# Patient Record
Sex: Male | Born: 1935 | Race: White | Hispanic: No | Marital: Married | Smoking: Former smoker
Health system: Southern US, Community
[De-identification: ages and names within clinical notes are randomized; demographics above are authoritative.]

## PROBLEM LIST (undated history)

## (undated) DIAGNOSIS — I10 Essential (primary) hypertension: Secondary | ICD-10-CM

## (undated) DIAGNOSIS — K648 Other hemorrhoids: Secondary | ICD-10-CM

## (undated) DIAGNOSIS — E079 Disorder of thyroid, unspecified: Secondary | ICD-10-CM

## (undated) DIAGNOSIS — K2990 Gastroduodenitis, unspecified, without bleeding: Secondary | ICD-10-CM

## (undated) DIAGNOSIS — D649 Anemia, unspecified: Secondary | ICD-10-CM

## (undated) DIAGNOSIS — E538 Deficiency of other specified B group vitamins: Secondary | ICD-10-CM

## (undated) DIAGNOSIS — K219 Gastro-esophageal reflux disease without esophagitis: Secondary | ICD-10-CM

## (undated) DIAGNOSIS — G629 Polyneuropathy, unspecified: Secondary | ICD-10-CM

## (undated) DIAGNOSIS — K5790 Diverticulosis of intestine, part unspecified, without perforation or abscess without bleeding: Secondary | ICD-10-CM

## (undated) DIAGNOSIS — E7800 Pure hypercholesterolemia, unspecified: Secondary | ICD-10-CM

## (undated) HISTORY — PX: UPPER GASTROINTESTINAL ENDOSCOPY: SHX188

## (undated) HISTORY — DX: Pure hypercholesterolemia, unspecified: E78.00

## (undated) HISTORY — DX: Diverticulosis of intestine, part unspecified, without perforation or abscess without bleeding: K57.90

## (undated) HISTORY — DX: Deficiency of other specified B group vitamins: E53.8

## (undated) HISTORY — PX: SKIN SURGERY: SHX2413

## (undated) HISTORY — DX: Polyneuropathy, unspecified: G62.9

## (undated) HISTORY — DX: Other hemorrhoids: K64.8

## (undated) HISTORY — DX: Gastroduodenitis, unspecified, without bleeding: K29.90

## (undated) HISTORY — DX: Anemia, unspecified: D64.9

---

## 2008-10-20 HISTORY — PX: ESOPHAGOGASTRODUODENOSCOPY: SHX1529

## 2014-10-10 HISTORY — PX: COLONOSCOPY: SHX174

## 2015-08-10 DIAGNOSIS — G629 Polyneuropathy, unspecified: Secondary | ICD-10-CM | POA: Diagnosis not present

## 2015-08-15 DIAGNOSIS — M7742 Metatarsalgia, left foot: Secondary | ICD-10-CM | POA: Diagnosis not present

## 2015-08-15 DIAGNOSIS — M7741 Metatarsalgia, right foot: Secondary | ICD-10-CM | POA: Diagnosis not present

## 2015-10-11 DIAGNOSIS — M79671 Pain in right foot: Secondary | ICD-10-CM

## 2015-10-11 DIAGNOSIS — R2689 Other abnormalities of gait and mobility: Secondary | ICD-10-CM | POA: Insufficient documentation

## 2015-10-11 DIAGNOSIS — R200 Anesthesia of skin: Secondary | ICD-10-CM | POA: Insufficient documentation

## 2015-10-11 DIAGNOSIS — M79672 Pain in left foot: Secondary | ICD-10-CM | POA: Diagnosis not present

## 2015-10-11 DIAGNOSIS — G629 Polyneuropathy, unspecified: Secondary | ICD-10-CM | POA: Diagnosis not present

## 2015-10-11 HISTORY — DX: Pain in right foot: M79.671

## 2015-10-11 HISTORY — DX: Anesthesia of skin: R20.0

## 2015-10-11 HISTORY — DX: Other abnormalities of gait and mobility: R26.89

## 2015-10-19 DIAGNOSIS — M5416 Radiculopathy, lumbar region: Secondary | ICD-10-CM | POA: Diagnosis not present

## 2015-10-24 DIAGNOSIS — M79671 Pain in right foot: Secondary | ICD-10-CM | POA: Diagnosis not present

## 2015-10-24 DIAGNOSIS — R2689 Other abnormalities of gait and mobility: Secondary | ICD-10-CM | POA: Diagnosis not present

## 2015-10-24 DIAGNOSIS — M79672 Pain in left foot: Secondary | ICD-10-CM | POA: Diagnosis not present

## 2015-10-24 DIAGNOSIS — R200 Anesthesia of skin: Secondary | ICD-10-CM | POA: Diagnosis not present

## 2015-11-21 DIAGNOSIS — R200 Anesthesia of skin: Secondary | ICD-10-CM | POA: Diagnosis not present

## 2015-11-21 DIAGNOSIS — R2689 Other abnormalities of gait and mobility: Secondary | ICD-10-CM | POA: Diagnosis not present

## 2015-11-21 DIAGNOSIS — M79672 Pain in left foot: Secondary | ICD-10-CM | POA: Diagnosis not present

## 2015-11-21 DIAGNOSIS — M79671 Pain in right foot: Secondary | ICD-10-CM | POA: Diagnosis not present

## 2015-11-27 DIAGNOSIS — H04123 Dry eye syndrome of bilateral lacrimal glands: Secondary | ICD-10-CM | POA: Diagnosis not present

## 2015-12-06 DIAGNOSIS — H26492 Other secondary cataract, left eye: Secondary | ICD-10-CM | POA: Diagnosis not present

## 2015-12-06 DIAGNOSIS — H26491 Other secondary cataract, right eye: Secondary | ICD-10-CM | POA: Diagnosis not present

## 2015-12-11 DIAGNOSIS — R197 Diarrhea, unspecified: Secondary | ICD-10-CM | POA: Diagnosis not present

## 2015-12-11 DIAGNOSIS — R110 Nausea: Secondary | ICD-10-CM | POA: Diagnosis not present

## 2015-12-11 DIAGNOSIS — R1030 Lower abdominal pain, unspecified: Secondary | ICD-10-CM | POA: Diagnosis not present

## 2015-12-13 DIAGNOSIS — K5900 Constipation, unspecified: Secondary | ICD-10-CM | POA: Diagnosis not present

## 2015-12-13 DIAGNOSIS — R1012 Left upper quadrant pain: Secondary | ICD-10-CM | POA: Diagnosis not present

## 2015-12-13 DIAGNOSIS — R197 Diarrhea, unspecified: Secondary | ICD-10-CM | POA: Diagnosis not present

## 2016-01-15 DIAGNOSIS — E538 Deficiency of other specified B group vitamins: Secondary | ICD-10-CM

## 2016-01-15 DIAGNOSIS — N400 Enlarged prostate without lower urinary tract symptoms: Secondary | ICD-10-CM

## 2016-01-15 DIAGNOSIS — I10 Essential (primary) hypertension: Secondary | ICD-10-CM | POA: Insufficient documentation

## 2016-01-15 DIAGNOSIS — E039 Hypothyroidism, unspecified: Secondary | ICD-10-CM | POA: Insufficient documentation

## 2016-01-15 DIAGNOSIS — G629 Polyneuropathy, unspecified: Secondary | ICD-10-CM | POA: Insufficient documentation

## 2016-01-15 DIAGNOSIS — N529 Male erectile dysfunction, unspecified: Secondary | ICD-10-CM

## 2016-01-15 DIAGNOSIS — G5600 Carpal tunnel syndrome, unspecified upper limb: Secondary | ICD-10-CM

## 2016-01-15 DIAGNOSIS — E291 Testicular hypofunction: Secondary | ICD-10-CM

## 2016-01-15 DIAGNOSIS — N62 Hypertrophy of breast: Secondary | ICD-10-CM

## 2016-01-15 DIAGNOSIS — E7800 Pure hypercholesterolemia, unspecified: Secondary | ICD-10-CM

## 2016-01-15 DIAGNOSIS — K4090 Unilateral inguinal hernia, without obstruction or gangrene, not specified as recurrent: Secondary | ICD-10-CM

## 2016-01-15 DIAGNOSIS — K219 Gastro-esophageal reflux disease without esophagitis: Secondary | ICD-10-CM | POA: Insufficient documentation

## 2016-01-15 DIAGNOSIS — I499 Cardiac arrhythmia, unspecified: Secondary | ICD-10-CM

## 2016-01-15 HISTORY — DX: Deficiency of other specified B group vitamins: E53.8

## 2016-01-15 HISTORY — DX: Carpal tunnel syndrome, unspecified upper limb: G56.00

## 2016-01-15 HISTORY — DX: Benign prostatic hyperplasia without lower urinary tract symptoms: N40.0

## 2016-01-15 HISTORY — DX: Pure hypercholesterolemia, unspecified: E78.00

## 2016-01-15 HISTORY — DX: Essential (primary) hypertension: I10

## 2016-01-15 HISTORY — DX: Unilateral inguinal hernia, without obstruction or gangrene, not specified as recurrent: K40.90

## 2016-01-15 HISTORY — DX: Hypothyroidism, unspecified: E03.9

## 2016-01-15 HISTORY — DX: Hypertrophy of breast: N62

## 2016-01-15 HISTORY — DX: Testicular hypofunction: E29.1

## 2016-01-15 HISTORY — DX: Male erectile dysfunction, unspecified: N52.9

## 2016-01-15 HISTORY — DX: Cardiac arrhythmia, unspecified: I49.9

## 2016-01-24 DIAGNOSIS — E7800 Pure hypercholesterolemia, unspecified: Secondary | ICD-10-CM | POA: Diagnosis not present

## 2016-01-24 DIAGNOSIS — Z79899 Other long term (current) drug therapy: Secondary | ICD-10-CM | POA: Diagnosis not present

## 2016-01-24 DIAGNOSIS — K219 Gastro-esophageal reflux disease without esophagitis: Secondary | ICD-10-CM | POA: Diagnosis not present

## 2016-01-24 DIAGNOSIS — I10 Essential (primary) hypertension: Secondary | ICD-10-CM | POA: Diagnosis not present

## 2016-01-24 DIAGNOSIS — G629 Polyneuropathy, unspecified: Secondary | ICD-10-CM | POA: Diagnosis not present

## 2016-01-24 DIAGNOSIS — E538 Deficiency of other specified B group vitamins: Secondary | ICD-10-CM | POA: Diagnosis not present

## 2016-01-24 DIAGNOSIS — Z23 Encounter for immunization: Secondary | ICD-10-CM | POA: Diagnosis not present

## 2016-01-24 DIAGNOSIS — E039 Hypothyroidism, unspecified: Secondary | ICD-10-CM | POA: Diagnosis not present

## 2016-01-24 DIAGNOSIS — Z0000 Encounter for general adult medical examination without abnormal findings: Secondary | ICD-10-CM | POA: Diagnosis not present

## 2016-02-21 DIAGNOSIS — M5416 Radiculopathy, lumbar region: Secondary | ICD-10-CM | POA: Insufficient documentation

## 2016-02-21 DIAGNOSIS — R200 Anesthesia of skin: Secondary | ICD-10-CM | POA: Diagnosis not present

## 2016-02-21 DIAGNOSIS — M79672 Pain in left foot: Secondary | ICD-10-CM | POA: Diagnosis not present

## 2016-02-21 DIAGNOSIS — M79671 Pain in right foot: Secondary | ICD-10-CM | POA: Diagnosis not present

## 2016-02-21 DIAGNOSIS — R2689 Other abnormalities of gait and mobility: Secondary | ICD-10-CM | POA: Diagnosis not present

## 2016-02-21 HISTORY — DX: Radiculopathy, lumbar region: M54.16

## 2016-06-06 DIAGNOSIS — J0140 Acute pansinusitis, unspecified: Secondary | ICD-10-CM | POA: Diagnosis not present

## 2016-06-06 DIAGNOSIS — I10 Essential (primary) hypertension: Secondary | ICD-10-CM | POA: Diagnosis not present

## 2016-07-30 DIAGNOSIS — K4090 Unilateral inguinal hernia, without obstruction or gangrene, not specified as recurrent: Secondary | ICD-10-CM | POA: Diagnosis not present

## 2016-07-30 DIAGNOSIS — E538 Deficiency of other specified B group vitamins: Secondary | ICD-10-CM | POA: Diagnosis not present

## 2016-07-30 DIAGNOSIS — N400 Enlarged prostate without lower urinary tract symptoms: Secondary | ICD-10-CM | POA: Diagnosis not present

## 2016-07-30 DIAGNOSIS — G629 Polyneuropathy, unspecified: Secondary | ICD-10-CM | POA: Diagnosis not present

## 2016-07-30 DIAGNOSIS — E039 Hypothyroidism, unspecified: Secondary | ICD-10-CM | POA: Diagnosis not present

## 2016-07-30 DIAGNOSIS — I10 Essential (primary) hypertension: Secondary | ICD-10-CM | POA: Diagnosis not present

## 2016-07-30 DIAGNOSIS — E7800 Pure hypercholesterolemia, unspecified: Secondary | ICD-10-CM | POA: Diagnosis not present

## 2016-07-30 DIAGNOSIS — K5904 Chronic idiopathic constipation: Secondary | ICD-10-CM | POA: Diagnosis not present

## 2016-07-30 DIAGNOSIS — K219 Gastro-esophageal reflux disease without esophagitis: Secondary | ICD-10-CM | POA: Diagnosis not present

## 2016-07-30 DIAGNOSIS — Z79899 Other long term (current) drug therapy: Secondary | ICD-10-CM | POA: Diagnosis not present

## 2016-07-30 DIAGNOSIS — Z0000 Encounter for general adult medical examination without abnormal findings: Secondary | ICD-10-CM | POA: Diagnosis not present

## 2016-08-15 DIAGNOSIS — R102 Pelvic and perineal pain: Secondary | ICD-10-CM | POA: Diagnosis not present

## 2016-08-15 DIAGNOSIS — R079 Chest pain, unspecified: Secondary | ICD-10-CM | POA: Diagnosis not present

## 2016-08-15 DIAGNOSIS — R0789 Other chest pain: Secondary | ICD-10-CM | POA: Diagnosis not present

## 2016-08-15 DIAGNOSIS — R1032 Left lower quadrant pain: Secondary | ICD-10-CM | POA: Diagnosis not present

## 2016-08-15 DIAGNOSIS — R1012 Left upper quadrant pain: Secondary | ICD-10-CM | POA: Diagnosis not present

## 2016-08-15 DIAGNOSIS — E039 Hypothyroidism, unspecified: Secondary | ICD-10-CM | POA: Diagnosis not present

## 2016-08-15 DIAGNOSIS — R109 Unspecified abdominal pain: Secondary | ICD-10-CM | POA: Diagnosis not present

## 2016-08-15 DIAGNOSIS — Z87891 Personal history of nicotine dependence: Secondary | ICD-10-CM | POA: Diagnosis not present

## 2016-08-15 DIAGNOSIS — R918 Other nonspecific abnormal finding of lung field: Secondary | ICD-10-CM | POA: Diagnosis not present

## 2016-08-15 DIAGNOSIS — I714 Abdominal aortic aneurysm, without rupture: Secondary | ICD-10-CM | POA: Diagnosis not present

## 2016-08-15 DIAGNOSIS — M25511 Pain in right shoulder: Secondary | ICD-10-CM | POA: Diagnosis not present

## 2016-08-15 DIAGNOSIS — M25512 Pain in left shoulder: Secondary | ICD-10-CM | POA: Diagnosis not present

## 2016-08-26 DIAGNOSIS — H26492 Other secondary cataract, left eye: Secondary | ICD-10-CM | POA: Diagnosis not present

## 2016-08-30 DIAGNOSIS — R0789 Other chest pain: Secondary | ICD-10-CM | POA: Diagnosis not present

## 2016-08-30 DIAGNOSIS — E7800 Pure hypercholesterolemia, unspecified: Secondary | ICD-10-CM | POA: Diagnosis not present

## 2016-08-30 DIAGNOSIS — I2510 Atherosclerotic heart disease of native coronary artery without angina pectoris: Secondary | ICD-10-CM | POA: Diagnosis not present

## 2016-09-11 DIAGNOSIS — M5416 Radiculopathy, lumbar region: Secondary | ICD-10-CM | POA: Diagnosis not present

## 2016-09-11 DIAGNOSIS — R200 Anesthesia of skin: Secondary | ICD-10-CM | POA: Diagnosis not present

## 2016-09-11 DIAGNOSIS — M79672 Pain in left foot: Secondary | ICD-10-CM | POA: Diagnosis not present

## 2016-09-11 DIAGNOSIS — M79671 Pain in right foot: Secondary | ICD-10-CM | POA: Diagnosis not present

## 2016-09-24 DIAGNOSIS — E7800 Pure hypercholesterolemia, unspecified: Secondary | ICD-10-CM | POA: Diagnosis not present

## 2016-09-24 DIAGNOSIS — I2510 Atherosclerotic heart disease of native coronary artery without angina pectoris: Secondary | ICD-10-CM | POA: Diagnosis not present

## 2016-09-24 DIAGNOSIS — R0789 Other chest pain: Secondary | ICD-10-CM | POA: Diagnosis not present

## 2016-09-25 DIAGNOSIS — M50321 Other cervical disc degeneration at C4-C5 level: Secondary | ICD-10-CM | POA: Diagnosis not present

## 2016-09-25 DIAGNOSIS — M5030 Other cervical disc degeneration, unspecified cervical region: Secondary | ICD-10-CM | POA: Insufficient documentation

## 2016-09-25 DIAGNOSIS — K219 Gastro-esophageal reflux disease without esophagitis: Secondary | ICD-10-CM | POA: Diagnosis not present

## 2016-09-25 DIAGNOSIS — R079 Chest pain, unspecified: Secondary | ICD-10-CM | POA: Diagnosis not present

## 2016-09-25 DIAGNOSIS — M542 Cervicalgia: Secondary | ICD-10-CM | POA: Diagnosis not present

## 2016-09-25 HISTORY — DX: Other cervical disc degeneration, unspecified cervical region: M50.30

## 2016-09-27 DIAGNOSIS — I6523 Occlusion and stenosis of bilateral carotid arteries: Secondary | ICD-10-CM

## 2016-09-27 HISTORY — DX: Occlusion and stenosis of bilateral carotid arteries: I65.23

## 2016-10-09 DIAGNOSIS — I7090 Unspecified atherosclerosis: Secondary | ICD-10-CM | POA: Diagnosis not present

## 2017-07-31 DIAGNOSIS — M25512 Pain in left shoulder: Secondary | ICD-10-CM | POA: Diagnosis not present

## 2017-07-31 DIAGNOSIS — M25511 Pain in right shoulder: Secondary | ICD-10-CM | POA: Diagnosis not present

## 2017-09-01 DIAGNOSIS — H26492 Other secondary cataract, left eye: Secondary | ICD-10-CM | POA: Diagnosis not present

## 2017-09-01 DIAGNOSIS — H35371 Puckering of macula, right eye: Secondary | ICD-10-CM | POA: Diagnosis not present

## 2017-09-08 DIAGNOSIS — D2239 Melanocytic nevi of other parts of face: Secondary | ICD-10-CM | POA: Diagnosis not present

## 2017-09-08 DIAGNOSIS — L570 Actinic keratosis: Secondary | ICD-10-CM | POA: Diagnosis not present

## 2017-09-08 DIAGNOSIS — C44319 Basal cell carcinoma of skin of other parts of face: Secondary | ICD-10-CM | POA: Diagnosis not present

## 2017-09-10 DIAGNOSIS — J209 Acute bronchitis, unspecified: Secondary | ICD-10-CM | POA: Diagnosis not present

## 2017-09-10 DIAGNOSIS — M5030 Other cervical disc degeneration, unspecified cervical region: Secondary | ICD-10-CM | POA: Diagnosis not present

## 2017-09-10 DIAGNOSIS — I10 Essential (primary) hypertension: Secondary | ICD-10-CM | POA: Diagnosis not present

## 2017-09-24 DIAGNOSIS — D0439 Carcinoma in situ of skin of other parts of face: Secondary | ICD-10-CM | POA: Diagnosis not present

## 2017-10-14 DIAGNOSIS — R5381 Other malaise: Secondary | ICD-10-CM | POA: Diagnosis not present

## 2017-10-14 DIAGNOSIS — E7800 Pure hypercholesterolemia, unspecified: Secondary | ICD-10-CM | POA: Diagnosis not present

## 2017-10-14 DIAGNOSIS — I10 Essential (primary) hypertension: Secondary | ICD-10-CM | POA: Diagnosis not present

## 2017-10-14 DIAGNOSIS — K219 Gastro-esophageal reflux disease without esophagitis: Secondary | ICD-10-CM | POA: Diagnosis not present

## 2017-10-14 DIAGNOSIS — E538 Deficiency of other specified B group vitamins: Secondary | ICD-10-CM | POA: Diagnosis not present

## 2017-10-14 DIAGNOSIS — R5383 Other fatigue: Secondary | ICD-10-CM | POA: Diagnosis not present

## 2017-10-14 DIAGNOSIS — M5030 Other cervical disc degeneration, unspecified cervical region: Secondary | ICD-10-CM | POA: Diagnosis not present

## 2017-10-14 DIAGNOSIS — E039 Hypothyroidism, unspecified: Secondary | ICD-10-CM | POA: Diagnosis not present

## 2017-10-14 DIAGNOSIS — Z0000 Encounter for general adult medical examination without abnormal findings: Secondary | ICD-10-CM | POA: Diagnosis not present

## 2017-10-29 DIAGNOSIS — M549 Dorsalgia, unspecified: Secondary | ICD-10-CM | POA: Diagnosis not present

## 2017-10-30 ENCOUNTER — Emergency Department (HOSPITAL_COMMUNITY)
Admission: EM | Admit: 2017-10-30 | Discharge: 2017-10-30 | Disposition: A | Discharge status: Home / Self Care | Payer: PPO | Attending: Physician Assistant | Admitting: Physician Assistant

## 2017-10-30 ENCOUNTER — Emergency Department (HOSPITAL_COMMUNITY): Payer: PPO

## 2017-10-30 ENCOUNTER — Encounter (HOSPITAL_COMMUNITY): Payer: Self-pay | Admitting: Emergency Medicine

## 2017-10-30 ENCOUNTER — Other Ambulatory Visit: Payer: Self-pay

## 2017-10-30 DIAGNOSIS — Y929 Unspecified place or not applicable: Secondary | ICD-10-CM | POA: Insufficient documentation

## 2017-10-30 DIAGNOSIS — Y999 Unspecified external cause status: Secondary | ICD-10-CM | POA: Diagnosis not present

## 2017-10-30 DIAGNOSIS — S239XXA Sprain of unspecified parts of thorax, initial encounter: Secondary | ICD-10-CM

## 2017-10-30 DIAGNOSIS — I10 Essential (primary) hypertension: Secondary | ICD-10-CM | POA: Diagnosis not present

## 2017-10-30 DIAGNOSIS — X58XXXA Exposure to other specified factors, initial encounter: Secondary | ICD-10-CM | POA: Insufficient documentation

## 2017-10-30 DIAGNOSIS — Z85828 Personal history of other malignant neoplasm of skin: Secondary | ICD-10-CM | POA: Insufficient documentation

## 2017-10-30 DIAGNOSIS — M545 Low back pain: Secondary | ICD-10-CM | POA: Diagnosis not present

## 2017-10-30 DIAGNOSIS — Y939 Activity, unspecified: Secondary | ICD-10-CM | POA: Insufficient documentation

## 2017-10-30 DIAGNOSIS — S233XXA Sprain of ligaments of thoracic spine, initial encounter: Secondary | ICD-10-CM | POA: Diagnosis not present

## 2017-10-30 DIAGNOSIS — S299XXA Unspecified injury of thorax, initial encounter: Secondary | ICD-10-CM | POA: Diagnosis present

## 2017-10-30 DIAGNOSIS — R52 Pain, unspecified: Secondary | ICD-10-CM

## 2017-10-30 DIAGNOSIS — M4184 Other forms of scoliosis, thoracic region: Secondary | ICD-10-CM | POA: Diagnosis not present

## 2017-10-30 DIAGNOSIS — M4804 Spinal stenosis, thoracic region: Secondary | ICD-10-CM | POA: Diagnosis not present

## 2017-10-30 HISTORY — DX: Essential (primary) hypertension: I10

## 2017-10-30 HISTORY — DX: Gastro-esophageal reflux disease without esophagitis: K21.9

## 2017-10-30 HISTORY — DX: Disorder of thyroid, unspecified: E07.9

## 2017-10-30 MED ORDER — HYDROCODONE-ACETAMINOPHEN 5-325 MG PO TABS: ORAL_TABLET | ORAL | 0 refills | Status: DC

## 2017-10-30 MED ORDER — IBUPROFEN 400 MG PO TABS
400.0000 mg | ORAL_TABLET | Freq: Once | ORAL | Status: AC
  Administered 2017-10-30: 400 mg via ORAL
  Filled 2017-10-30: qty 1

## 2017-10-30 MED ORDER — CAMPHOR-MENTHOL-METHYL SAL 1.2-5.7-6.3 % EX PTCH: MEDICATED_PATCH | CUTANEOUS | 0 refills | Status: DC

## 2017-10-30 NOTE — ED Triage Notes (Signed)
Patient complains of back pain that has gradually gotten more severe for the last x7 days. Patient came in today because the pain is his back has become severe enough to make it difficult to get out of the bed. Denies weakness in extremities. Denies fall or any other trauma but states he works in his yard frequently.

## 2017-10-30 NOTE — ED Notes (Signed)
Pt in XR. 

## 2017-10-30 NOTE — ED Notes (Signed)
Patient transported to X-ray 

## 2017-10-30 NOTE — Discharge Instructions (Signed)
Please follow with your primary care doctor in the next 2 days for a check-up. They must obtain records for further management.   Do not hesitate to return to the Emergency Department for any new, worsening or concerning symptoms.   Take vicodin for breakthrough pain, do not drink alcohol, drive, care for children or do other critical tasks while taking vicodin.

## 2017-10-30 NOTE — ED Notes (Signed)
Pt verbalized understanding of all d/c instructions, prescriptions, and f/u information. VSS. All belongings with patient at this time.  

## 2017-10-30 NOTE — ED Notes (Signed)
Delay explained to pt and family. Notified patient and family of order for second XR

## 2017-10-30 NOTE — ED Provider Notes (Signed)
[facility] EMERGENCY DEPARTMENT Provider Note   CSN: [id number] Arrival date & time: 10/30/17  1029     History   Chief Complaint Chief Complaint  Patient presents with  . Back Pain   HPI  Blood pressure (!) 148/85, pulse 68, temperature 99.8 F (37.7 C), temperature source Oral, resp. rate 14, height 5' 10.5" (1.791 m), weight 79.4 kg (175 lb), SpO2 98 %.  Steven Valenzuela is a 82 y.o. male with past medical history significant for hypertension, thyroid disease complaining of atraumatic lower thoracic back pain onset a week ago, has been taking Tylenol at home with little relief.  States that the pain is exacerbated by movement, position and palpation.  There is no associated cough, palpitations, shortness of breath, fever or chills.  He states this is atypical for him.  He is never had any issues with this region however he does have scoliosis and has chronic lumbar issues.  He denies any abdominal pain he has a history of skin cancer.  Past Medical History:  Diagnosis Date  . GERD (gastroesophageal reflux disease)   . Hypertension   . Thyroid disease     There are no active problems to display for this patient.   History reviewed. No pertinent surgical history.      Home Medications    Prior to Admission medications   Medication Sig Start Date End Date Taking? Authorizing Provider  Camphor-Menthol-Methyl Sal (HM SALONPAS PAIN RELIEF) 1.2-5.7-6.3 % PTCH Clean and dry affected area, remove patch from backing film and apply to skin. Apply one patch to the affected area and leave in place for up to 8 to 12 hours. If pain lasts after using the first patch, a second patch may be applied for up to another 8 to 12 hours. 10/30/17   Nissan Frazzini, Elmyra Ricks, PA-C  HYDROcodone-acetaminophen (NORCO/VICODIN) 5-325 MG tablet Take 1-2 tablets by mouth every 6 hours as needed for pain and/or cough. 10/30/17   Flor Houdeshell, Charna Elizabeth    Family History No family history on  file.  Social History Social History   Tobacco Use  . Smoking status: Never Smoker  Substance Use Topics  . Alcohol use: Not Currently  . Drug use: Never     Allergies   Bactrim [sulfamethoxazole-trimethoprim]; Prednisone; and Sulfa antibiotics   Review of Systems Review of Systems  A complete review of systems was obtained and all systems are negative except as noted in the HPI and PMH.   Physical Exam Updated Vital Signs BP 128/88 (BP Location: Right Arm)   Pulse 65   Temp 98.4 F (36.9 C) (Oral)   Resp 14   Ht 5' 10.5" (1.791 m)   Wt 79.4 kg (175 lb)   SpO2 97%   BMI 24.75 kg/m   Physical Exam  Constitutional: He is oriented to person, place, and time. He appears well-developed and well-nourished. No distress.  HENT:  Head: Normocephalic and atraumatic.  Mouth/Throat: Oropharynx is clear and moist.  Eyes: Pupils are equal, round, and reactive to light. Conjunctivae and EOM are normal.  Neck: Normal range of motion. No JVD present. No tracheal deviation present.  Cardiovascular: Normal rate, regular rhythm and intact distal pulses.  Radial pulse equal bilaterally  Pulmonary/Chest: Effort normal and breath sounds normal. No stridor. No respiratory distress. He has no wheezes. He has no rales.  Abdominal: Soft. He exhibits no distension and no mass. There is no tenderness. There is no rebound and no guarding.  Musculoskeletal: Normal range of  motion. He exhibits no edema or tenderness.       Back:  No calf asymmetry, superficial collaterals, palpable cords, edema, Homans sign negative bilaterally.    Neurological: He is alert and oriented to person, place, and time.  Skin: Skin is warm. He is not diaphoretic.  Psychiatric: He has a normal mood and affect.  Nursing note and vitals reviewed.    ED Treatments / Results  Labs (all labs ordered are listed, but only abnormal results are displayed) Labs Reviewed - No data to display  EKG None  Radiology Dg  Thoracic Spine 2 View  Result Date: 10/30/2017 CLINICAL DATA:  Dorsalgia EXAM: THORACIC SPINE 3 VIEWS COMPARISON:  None.  Chest radiograph June 15, 2017 FINDINGS: Frontal, lateral, and swimmer's views were obtained. There is lower thoracic levoscoliosis. No fracture or spondylolisthesis. There is mild disc space narrowing at several levels. No erosive change or paraspinous lesion. There is aortic atherosclerosis. IMPRESSION: Mild scoliosis in the lower thoracic region. Osteoarthritic change noted at several levels. No fracture or spondylolisthesis. There is aortic atherosclerosis. Aortic Atherosclerosis (ICD10-I70.0). Electronically Signed   By: Lowella Grip III M.D.   On: 10/30/2017 15:00   Dg Lumbar Spine Complete  Result Date: 10/30/2017 CLINICAL DATA:  Low back pain.  No known injury. EXAM: LUMBAR SPINE - COMPLETE 4+ VIEW COMPARISON:  CT 12/13/2015. FINDINGS: Lumbar scoliosis concave left. Minimal compressions of T11, T12, and L1 cannot be completely excluded. Perceived minimal compressions may just be related scoliosis. No prominent compression fracture. No other focal abnormality identified. Diffuse multilevel degenerative change. Aortoiliac atherosclerotic vascular calcification. IMPRESSION: 1. Lumbar spine scoliosis concave left. Diffuse multilevel degenerative change. 2. Minimal compressions of T11, T12, and L1 cannot be completely excluded. Perceived minimal compression fractures may just be related to scoliosis. No prominent compression fracture. 3.  Aortoiliac atherosclerotic vascular disease. Electronically Signed   By: Marcello Moores  Register   On: 10/30/2017 13:48    Procedures Procedures (including critical care time)  Medications Ordered in ED Medications  ibuprofen (ADVIL,MOTRIN) tablet 400 mg (400 mg Oral Given 10/30/17 1538)     Initial Impression / Assessment and Plan / ED Course  I have reviewed the triage vital signs and the nursing notes.  Pertinent labs & imaging  results that were available during my care of the patient were reviewed by me and considered in my medical decision making (see chart for details).    Vitals:   10/30/17 1054 10/30/17 1055 10/30/17 1530  BP: (!) 148/85  128/88  Pulse: 68  65  Resp: 14  14  Temp: 99.8 F (37.7 C)  98.4 F (36.9 C)  TempSrc: Oral  Oral  SpO2: 98%  97%  Weight: 79.4 kg (175 lb) 79.4 kg (175 lb)   Height: 5\' 10"  (1.778 m) 5' 10.5" (1.791 m)     Medications  ibuprofen (ADVIL,MOTRIN) tablet 400 mg (400 mg Oral Given 10/30/17 1538)    Nussen Pullin is 82 y.o. male presenting with lower thoracic pain worsening over the course of a week.  She declines pain medication in the ED.  He has no associated cough, shortness of breath, anterior chest pain.  Physical exam reassuring with tenderness along the lower thoracic spine.  Also states that the pain is positional.  I doubt a cardiac/vascular/pulmonary origin of his pain.  Lumbar x-ray with possible contract compression fractures of T11 and T12.  Thoracic x-ray does not show any significant abnormalities.  Patient has been taking Aminofen at home, I have encouraged  him to take ibuprofen and use salon pause patches.  Vicodin for breakthrough pain.  Patient states he has had chronic neck pain and is requesting neurosurgery referral.  I advised him to also follow closely with primary care.  Extensive discussion of return precautions and patient verbalized understanding and teach back technique.  Evaluation does not show pathology that would require ongoing emergent intervention or inpatient treatment. Pt is hemodynamically stable and mentating appropriately. Discussed findings and plan with patient/guardian, who agrees with care plan. All questions answered. Return precautions discussed and outpatient follow up given.      Final Clinical Impressions(s) / ED Diagnoses   Final diagnoses:  Thoracic back sprain, initial encounter    ED Discharge Orders        Ordered     Camphor-Menthol-Methyl Sal (HM SALONPAS PAIN RELIEF) 1.2-5.7-6.3 % PTCH     10/30/17 1534    HYDROcodone-acetaminophen (NORCO/VICODIN) 5-325 MG tablet     10/30/17 1534       Tonya Wantz, Charna Elizabeth 10/30/17 1827    Mackuen, Fredia Sorrow, MD 10/31/17 1943

## 2017-11-03 DIAGNOSIS — I10 Essential (primary) hypertension: Secondary | ICD-10-CM | POA: Diagnosis not present

## 2017-11-03 DIAGNOSIS — M150 Primary generalized (osteo)arthritis: Secondary | ICD-10-CM | POA: Diagnosis not present

## 2017-11-03 DIAGNOSIS — M62830 Muscle spasm of back: Secondary | ICD-10-CM | POA: Diagnosis not present

## 2017-11-03 DIAGNOSIS — M5416 Radiculopathy, lumbar region: Secondary | ICD-10-CM | POA: Diagnosis not present

## 2017-12-10 DIAGNOSIS — M5412 Radiculopathy, cervical region: Secondary | ICD-10-CM | POA: Diagnosis not present

## 2017-12-17 DIAGNOSIS — I10 Essential (primary) hypertension: Secondary | ICD-10-CM | POA: Diagnosis not present

## 2017-12-17 DIAGNOSIS — M5412 Radiculopathy, cervical region: Secondary | ICD-10-CM | POA: Diagnosis not present

## 2017-12-17 DIAGNOSIS — Z135 Encounter for screening for eye and ear disorders: Secondary | ICD-10-CM | POA: Diagnosis not present

## 2017-12-17 DIAGNOSIS — M4802 Spinal stenosis, cervical region: Secondary | ICD-10-CM | POA: Diagnosis not present

## 2017-12-17 DIAGNOSIS — Z6825 Body mass index (BMI) 25.0-25.9, adult: Secondary | ICD-10-CM | POA: Diagnosis not present

## 2018-02-18 DIAGNOSIS — I10 Essential (primary) hypertension: Secondary | ICD-10-CM | POA: Diagnosis not present

## 2018-02-18 DIAGNOSIS — Z6825 Body mass index (BMI) 25.0-25.9, adult: Secondary | ICD-10-CM | POA: Diagnosis not present

## 2018-02-18 DIAGNOSIS — M5412 Radiculopathy, cervical region: Secondary | ICD-10-CM | POA: Diagnosis not present

## 2018-02-24 DIAGNOSIS — M25511 Pain in right shoulder: Secondary | ICD-10-CM | POA: Diagnosis not present

## 2018-02-24 DIAGNOSIS — M6281 Muscle weakness (generalized): Secondary | ICD-10-CM | POA: Diagnosis not present

## 2018-02-24 DIAGNOSIS — M25612 Stiffness of left shoulder, not elsewhere classified: Secondary | ICD-10-CM | POA: Diagnosis not present

## 2018-02-24 DIAGNOSIS — M5489 Other dorsalgia: Secondary | ICD-10-CM | POA: Diagnosis not present

## 2018-02-24 DIAGNOSIS — M25512 Pain in left shoulder: Secondary | ICD-10-CM | POA: Diagnosis not present

## 2018-02-24 DIAGNOSIS — M5412 Radiculopathy, cervical region: Secondary | ICD-10-CM | POA: Diagnosis not present

## 2018-02-24 DIAGNOSIS — M542 Cervicalgia: Secondary | ICD-10-CM | POA: Diagnosis not present

## 2018-02-24 DIAGNOSIS — M2560 Stiffness of unspecified joint, not elsewhere classified: Secondary | ICD-10-CM | POA: Diagnosis not present

## 2018-02-24 DIAGNOSIS — R293 Abnormal posture: Secondary | ICD-10-CM | POA: Diagnosis not present

## 2018-02-24 DIAGNOSIS — M25611 Stiffness of right shoulder, not elsewhere classified: Secondary | ICD-10-CM | POA: Diagnosis not present

## 2018-03-06 DIAGNOSIS — M542 Cervicalgia: Secondary | ICD-10-CM | POA: Diagnosis not present

## 2018-03-06 DIAGNOSIS — R293 Abnormal posture: Secondary | ICD-10-CM | POA: Diagnosis not present

## 2018-03-06 DIAGNOSIS — M5489 Other dorsalgia: Secondary | ICD-10-CM | POA: Diagnosis not present

## 2018-03-06 DIAGNOSIS — M25511 Pain in right shoulder: Secondary | ICD-10-CM | POA: Diagnosis not present

## 2018-03-06 DIAGNOSIS — M5412 Radiculopathy, cervical region: Secondary | ICD-10-CM | POA: Diagnosis not present

## 2018-03-06 DIAGNOSIS — M25512 Pain in left shoulder: Secondary | ICD-10-CM | POA: Diagnosis not present

## 2018-03-06 DIAGNOSIS — M25611 Stiffness of right shoulder, not elsewhere classified: Secondary | ICD-10-CM | POA: Diagnosis not present

## 2018-03-06 DIAGNOSIS — M2560 Stiffness of unspecified joint, not elsewhere classified: Secondary | ICD-10-CM | POA: Diagnosis not present

## 2018-03-06 DIAGNOSIS — M25612 Stiffness of left shoulder, not elsewhere classified: Secondary | ICD-10-CM | POA: Diagnosis not present

## 2018-03-06 DIAGNOSIS — M6281 Muscle weakness (generalized): Secondary | ICD-10-CM | POA: Diagnosis not present

## 2018-03-18 DIAGNOSIS — Z6826 Body mass index (BMI) 26.0-26.9, adult: Secondary | ICD-10-CM | POA: Diagnosis not present

## 2018-03-18 DIAGNOSIS — I10 Essential (primary) hypertension: Secondary | ICD-10-CM | POA: Diagnosis not present

## 2018-06-09 DIAGNOSIS — E039 Hypothyroidism, unspecified: Secondary | ICD-10-CM | POA: Diagnosis not present

## 2018-06-09 DIAGNOSIS — Z0000 Encounter for general adult medical examination without abnormal findings: Secondary | ICD-10-CM | POA: Diagnosis not present

## 2018-06-09 DIAGNOSIS — N400 Enlarged prostate without lower urinary tract symptoms: Secondary | ICD-10-CM | POA: Diagnosis not present

## 2018-06-09 DIAGNOSIS — I10 Essential (primary) hypertension: Secondary | ICD-10-CM | POA: Diagnosis not present

## 2018-06-09 DIAGNOSIS — E7800 Pure hypercholesterolemia, unspecified: Secondary | ICD-10-CM | POA: Diagnosis not present

## 2018-06-09 DIAGNOSIS — Z23 Encounter for immunization: Secondary | ICD-10-CM | POA: Diagnosis not present

## 2018-06-16 DIAGNOSIS — I10 Essential (primary) hypertension: Secondary | ICD-10-CM | POA: Diagnosis not present

## 2018-06-16 DIAGNOSIS — E039 Hypothyroidism, unspecified: Secondary | ICD-10-CM | POA: Diagnosis not present

## 2018-06-16 DIAGNOSIS — N400 Enlarged prostate without lower urinary tract symptoms: Secondary | ICD-10-CM | POA: Diagnosis not present

## 2018-06-16 DIAGNOSIS — E7800 Pure hypercholesterolemia, unspecified: Secondary | ICD-10-CM | POA: Diagnosis not present

## 2018-08-31 DIAGNOSIS — H35371 Puckering of macula, right eye: Secondary | ICD-10-CM | POA: Diagnosis not present

## 2018-09-08 DIAGNOSIS — M25512 Pain in left shoulder: Secondary | ICD-10-CM | POA: Diagnosis not present

## 2018-09-08 DIAGNOSIS — M25511 Pain in right shoulder: Secondary | ICD-10-CM | POA: Diagnosis not present

## 2018-09-08 DIAGNOSIS — G8929 Other chronic pain: Secondary | ICD-10-CM | POA: Diagnosis not present

## 2018-09-08 DIAGNOSIS — M19011 Primary osteoarthritis, right shoulder: Secondary | ICD-10-CM | POA: Diagnosis not present

## 2018-09-08 DIAGNOSIS — M19012 Primary osteoarthritis, left shoulder: Secondary | ICD-10-CM | POA: Diagnosis not present

## 2018-09-15 DIAGNOSIS — J0100 Acute maxillary sinusitis, unspecified: Secondary | ICD-10-CM | POA: Diagnosis not present

## 2018-09-27 DIAGNOSIS — L509 Urticaria, unspecified: Secondary | ICD-10-CM | POA: Diagnosis not present

## 2018-11-13 DIAGNOSIS — J069 Acute upper respiratory infection, unspecified: Secondary | ICD-10-CM | POA: Diagnosis not present

## 2018-11-13 DIAGNOSIS — E039 Hypothyroidism, unspecified: Secondary | ICD-10-CM | POA: Diagnosis not present

## 2018-11-13 DIAGNOSIS — G5680 Other specified mononeuropathies of unspecified upper limb: Secondary | ICD-10-CM | POA: Diagnosis not present

## 2018-12-10 DIAGNOSIS — L570 Actinic keratosis: Secondary | ICD-10-CM | POA: Diagnosis not present

## 2018-12-10 DIAGNOSIS — L578 Other skin changes due to chronic exposure to nonionizing radiation: Secondary | ICD-10-CM | POA: Diagnosis not present

## 2019-01-12 DIAGNOSIS — G8929 Other chronic pain: Secondary | ICD-10-CM | POA: Diagnosis not present

## 2019-01-12 DIAGNOSIS — M25512 Pain in left shoulder: Secondary | ICD-10-CM | POA: Diagnosis not present

## 2019-01-12 DIAGNOSIS — M25511 Pain in right shoulder: Secondary | ICD-10-CM | POA: Diagnosis not present

## 2019-02-11 DIAGNOSIS — M25512 Pain in left shoulder: Secondary | ICD-10-CM | POA: Diagnosis not present

## 2019-02-11 DIAGNOSIS — M25511 Pain in right shoulder: Secondary | ICD-10-CM | POA: Diagnosis not present

## 2019-02-11 DIAGNOSIS — G8929 Other chronic pain: Secondary | ICD-10-CM | POA: Diagnosis not present

## 2019-02-16 DIAGNOSIS — M25512 Pain in left shoulder: Secondary | ICD-10-CM | POA: Diagnosis not present

## 2019-02-16 DIAGNOSIS — M6281 Muscle weakness (generalized): Secondary | ICD-10-CM | POA: Diagnosis not present

## 2019-02-16 DIAGNOSIS — M2560 Stiffness of unspecified joint, not elsewhere classified: Secondary | ICD-10-CM | POA: Diagnosis not present

## 2019-02-16 DIAGNOSIS — M25511 Pain in right shoulder: Secondary | ICD-10-CM | POA: Diagnosis not present

## 2019-02-16 DIAGNOSIS — M25612 Stiffness of left shoulder, not elsewhere classified: Secondary | ICD-10-CM | POA: Diagnosis not present

## 2019-02-16 DIAGNOSIS — M25611 Stiffness of right shoulder, not elsewhere classified: Secondary | ICD-10-CM | POA: Diagnosis not present

## 2019-02-16 DIAGNOSIS — M79601 Pain in right arm: Secondary | ICD-10-CM | POA: Diagnosis not present

## 2019-02-16 DIAGNOSIS — R293 Abnormal posture: Secondary | ICD-10-CM | POA: Diagnosis not present

## 2019-03-08 DIAGNOSIS — M25611 Stiffness of right shoulder, not elsewhere classified: Secondary | ICD-10-CM | POA: Diagnosis not present

## 2019-03-08 DIAGNOSIS — M79601 Pain in right arm: Secondary | ICD-10-CM | POA: Diagnosis not present

## 2019-03-08 DIAGNOSIS — M2560 Stiffness of unspecified joint, not elsewhere classified: Secondary | ICD-10-CM | POA: Diagnosis not present

## 2019-03-08 DIAGNOSIS — M6281 Muscle weakness (generalized): Secondary | ICD-10-CM | POA: Diagnosis not present

## 2019-03-08 DIAGNOSIS — M25512 Pain in left shoulder: Secondary | ICD-10-CM | POA: Diagnosis not present

## 2019-03-08 DIAGNOSIS — R293 Abnormal posture: Secondary | ICD-10-CM | POA: Diagnosis not present

## 2019-03-08 DIAGNOSIS — M25612 Stiffness of left shoulder, not elsewhere classified: Secondary | ICD-10-CM | POA: Diagnosis not present

## 2019-03-08 DIAGNOSIS — M25511 Pain in right shoulder: Secondary | ICD-10-CM | POA: Diagnosis not present

## 2019-03-25 DIAGNOSIS — M25511 Pain in right shoulder: Secondary | ICD-10-CM | POA: Diagnosis not present

## 2019-03-25 DIAGNOSIS — G8929 Other chronic pain: Secondary | ICD-10-CM | POA: Diagnosis not present

## 2019-03-25 DIAGNOSIS — Z9889 Other specified postprocedural states: Secondary | ICD-10-CM | POA: Diagnosis not present

## 2019-03-31 DIAGNOSIS — M25511 Pain in right shoulder: Secondary | ICD-10-CM | POA: Diagnosis not present

## 2019-04-06 DIAGNOSIS — M25511 Pain in right shoulder: Secondary | ICD-10-CM | POA: Diagnosis not present

## 2019-04-06 DIAGNOSIS — G8929 Other chronic pain: Secondary | ICD-10-CM | POA: Diagnosis not present

## 2019-04-06 DIAGNOSIS — M25512 Pain in left shoulder: Secondary | ICD-10-CM | POA: Diagnosis not present

## 2019-04-19 DIAGNOSIS — M75101 Unspecified rotator cuff tear or rupture of right shoulder, not specified as traumatic: Secondary | ICD-10-CM | POA: Diagnosis not present

## 2019-04-19 DIAGNOSIS — M7501 Adhesive capsulitis of right shoulder: Secondary | ICD-10-CM | POA: Diagnosis not present

## 2019-05-11 DIAGNOSIS — Z23 Encounter for immunization: Secondary | ICD-10-CM | POA: Diagnosis not present

## 2019-06-01 DIAGNOSIS — M7501 Adhesive capsulitis of right shoulder: Secondary | ICD-10-CM | POA: Diagnosis not present

## 2019-06-15 DIAGNOSIS — Z87891 Personal history of nicotine dependence: Secondary | ICD-10-CM | POA: Diagnosis not present

## 2019-06-15 DIAGNOSIS — M25511 Pain in right shoulder: Secondary | ICD-10-CM | POA: Diagnosis not present

## 2019-06-15 DIAGNOSIS — Z13228 Encounter for screening for other metabolic disorders: Secondary | ICD-10-CM | POA: Diagnosis not present

## 2019-06-15 DIAGNOSIS — J8410 Pulmonary fibrosis, unspecified: Secondary | ICD-10-CM | POA: Diagnosis not present

## 2019-06-15 DIAGNOSIS — Z13220 Encounter for screening for lipoid disorders: Secondary | ICD-10-CM | POA: Diagnosis not present

## 2019-06-15 DIAGNOSIS — Z130 Encounter for screening for diseases of the blood and blood-forming organs and certain disorders involving the immune mechanism: Secondary | ICD-10-CM | POA: Diagnosis not present

## 2019-06-15 DIAGNOSIS — M62838 Other muscle spasm: Secondary | ICD-10-CM | POA: Diagnosis not present

## 2019-06-15 DIAGNOSIS — Z1321 Encounter for screening for nutritional disorder: Secondary | ICD-10-CM | POA: Diagnosis not present

## 2019-06-15 DIAGNOSIS — M25512 Pain in left shoulder: Secondary | ICD-10-CM | POA: Diagnosis not present

## 2019-06-15 DIAGNOSIS — Z125 Encounter for screening for malignant neoplasm of prostate: Secondary | ICD-10-CM | POA: Diagnosis not present

## 2019-06-15 DIAGNOSIS — G8929 Other chronic pain: Secondary | ICD-10-CM | POA: Diagnosis not present

## 2019-06-15 DIAGNOSIS — Z01818 Encounter for other preprocedural examination: Secondary | ICD-10-CM | POA: Diagnosis not present

## 2019-06-15 DIAGNOSIS — I10 Essential (primary) hypertension: Secondary | ICD-10-CM | POA: Diagnosis not present

## 2019-06-15 DIAGNOSIS — E538 Deficiency of other specified B group vitamins: Secondary | ICD-10-CM | POA: Diagnosis not present

## 2019-06-15 DIAGNOSIS — I491 Atrial premature depolarization: Secondary | ICD-10-CM | POA: Diagnosis not present

## 2019-06-15 DIAGNOSIS — Z0000 Encounter for general adult medical examination without abnormal findings: Secondary | ICD-10-CM | POA: Diagnosis not present

## 2019-06-15 DIAGNOSIS — E039 Hypothyroidism, unspecified: Secondary | ICD-10-CM | POA: Diagnosis not present

## 2019-06-25 DIAGNOSIS — M24111 Other articular cartilage disorders, right shoulder: Secondary | ICD-10-CM | POA: Diagnosis not present

## 2019-06-25 DIAGNOSIS — G629 Polyneuropathy, unspecified: Secondary | ICD-10-CM | POA: Diagnosis not present

## 2019-06-25 DIAGNOSIS — M7522 Bicipital tendinitis, left shoulder: Secondary | ICD-10-CM | POA: Diagnosis not present

## 2019-06-25 DIAGNOSIS — M75101 Unspecified rotator cuff tear or rupture of right shoulder, not specified as traumatic: Secondary | ICD-10-CM | POA: Diagnosis not present

## 2019-06-25 DIAGNOSIS — M7521 Bicipital tendinitis, right shoulder: Secondary | ICD-10-CM | POA: Diagnosis not present

## 2019-06-25 DIAGNOSIS — Z8719 Personal history of other diseases of the digestive system: Secondary | ICD-10-CM | POA: Diagnosis not present

## 2019-06-25 DIAGNOSIS — I10 Essential (primary) hypertension: Secondary | ICD-10-CM | POA: Diagnosis not present

## 2019-06-25 DIAGNOSIS — E039 Hypothyroidism, unspecified: Secondary | ICD-10-CM | POA: Diagnosis not present

## 2019-06-25 DIAGNOSIS — G8918 Other acute postprocedural pain: Secondary | ICD-10-CM | POA: Diagnosis not present

## 2019-06-25 DIAGNOSIS — Z8679 Personal history of other diseases of the circulatory system: Secondary | ICD-10-CM | POA: Diagnosis not present

## 2019-06-25 DIAGNOSIS — E7800 Pure hypercholesterolemia, unspecified: Secondary | ICD-10-CM | POA: Diagnosis not present

## 2019-06-25 DIAGNOSIS — Z79899 Other long term (current) drug therapy: Secondary | ICD-10-CM | POA: Diagnosis not present

## 2019-06-25 DIAGNOSIS — M7551 Bursitis of right shoulder: Secondary | ICD-10-CM | POA: Diagnosis not present

## 2019-06-25 DIAGNOSIS — K219 Gastro-esophageal reflux disease without esophagitis: Secondary | ICD-10-CM | POA: Diagnosis not present

## 2019-06-25 DIAGNOSIS — I4891 Unspecified atrial fibrillation: Secondary | ICD-10-CM | POA: Diagnosis not present

## 2019-06-25 DIAGNOSIS — J302 Other seasonal allergic rhinitis: Secondary | ICD-10-CM | POA: Diagnosis not present

## 2019-06-25 DIAGNOSIS — M7501 Adhesive capsulitis of right shoulder: Secondary | ICD-10-CM | POA: Diagnosis not present

## 2019-06-25 DIAGNOSIS — M75122 Complete rotator cuff tear or rupture of left shoulder, not specified as traumatic: Secondary | ICD-10-CM | POA: Diagnosis not present

## 2019-06-25 HISTORY — PX: SHOULDER SURGERY: SHX246

## 2019-06-27 DIAGNOSIS — R05 Cough: Secondary | ICD-10-CM | POA: Diagnosis not present

## 2019-06-29 DIAGNOSIS — Z20828 Contact with and (suspected) exposure to other viral communicable diseases: Secondary | ICD-10-CM | POA: Diagnosis not present

## 2019-06-29 DIAGNOSIS — J3489 Other specified disorders of nose and nasal sinuses: Secondary | ICD-10-CM | POA: Diagnosis not present

## 2019-06-30 DIAGNOSIS — M25411 Effusion, right shoulder: Secondary | ICD-10-CM | POA: Diagnosis not present

## 2019-06-30 DIAGNOSIS — M25611 Stiffness of right shoulder, not elsewhere classified: Secondary | ICD-10-CM | POA: Diagnosis not present

## 2019-06-30 DIAGNOSIS — M25511 Pain in right shoulder: Secondary | ICD-10-CM | POA: Diagnosis not present

## 2019-07-05 DIAGNOSIS — M25411 Effusion, right shoulder: Secondary | ICD-10-CM | POA: Diagnosis not present

## 2019-07-05 DIAGNOSIS — M25511 Pain in right shoulder: Secondary | ICD-10-CM | POA: Diagnosis not present

## 2019-07-05 DIAGNOSIS — M25611 Stiffness of right shoulder, not elsewhere classified: Secondary | ICD-10-CM | POA: Diagnosis not present

## 2019-07-09 DIAGNOSIS — M25611 Stiffness of right shoulder, not elsewhere classified: Secondary | ICD-10-CM | POA: Diagnosis not present

## 2019-07-09 DIAGNOSIS — M25411 Effusion, right shoulder: Secondary | ICD-10-CM | POA: Diagnosis not present

## 2019-07-09 DIAGNOSIS — M25511 Pain in right shoulder: Secondary | ICD-10-CM | POA: Diagnosis not present

## 2019-07-12 DIAGNOSIS — M25511 Pain in right shoulder: Secondary | ICD-10-CM | POA: Diagnosis not present

## 2019-07-12 DIAGNOSIS — M25411 Effusion, right shoulder: Secondary | ICD-10-CM | POA: Diagnosis not present

## 2019-07-12 DIAGNOSIS — M25611 Stiffness of right shoulder, not elsewhere classified: Secondary | ICD-10-CM | POA: Diagnosis not present

## 2019-07-16 DIAGNOSIS — M25611 Stiffness of right shoulder, not elsewhere classified: Secondary | ICD-10-CM | POA: Diagnosis not present

## 2019-07-16 DIAGNOSIS — M25411 Effusion, right shoulder: Secondary | ICD-10-CM | POA: Diagnosis not present

## 2019-07-16 DIAGNOSIS — M25511 Pain in right shoulder: Secondary | ICD-10-CM | POA: Diagnosis not present

## 2019-07-20 DIAGNOSIS — M25611 Stiffness of right shoulder, not elsewhere classified: Secondary | ICD-10-CM | POA: Diagnosis not present

## 2019-07-20 DIAGNOSIS — M25511 Pain in right shoulder: Secondary | ICD-10-CM | POA: Diagnosis not present

## 2019-07-20 DIAGNOSIS — M25411 Effusion, right shoulder: Secondary | ICD-10-CM | POA: Diagnosis not present

## 2019-07-23 DIAGNOSIS — M25411 Effusion, right shoulder: Secondary | ICD-10-CM | POA: Diagnosis not present

## 2019-07-23 DIAGNOSIS — M25511 Pain in right shoulder: Secondary | ICD-10-CM | POA: Diagnosis not present

## 2019-07-23 DIAGNOSIS — M25611 Stiffness of right shoulder, not elsewhere classified: Secondary | ICD-10-CM | POA: Diagnosis not present

## 2019-07-27 DIAGNOSIS — M25411 Effusion, right shoulder: Secondary | ICD-10-CM | POA: Diagnosis not present

## 2019-07-27 DIAGNOSIS — M25511 Pain in right shoulder: Secondary | ICD-10-CM | POA: Diagnosis not present

## 2019-07-27 DIAGNOSIS — M25611 Stiffness of right shoulder, not elsewhere classified: Secondary | ICD-10-CM | POA: Diagnosis not present

## 2019-08-02 DIAGNOSIS — M25411 Effusion, right shoulder: Secondary | ICD-10-CM | POA: Diagnosis not present

## 2019-08-02 DIAGNOSIS — M25511 Pain in right shoulder: Secondary | ICD-10-CM | POA: Diagnosis not present

## 2019-08-02 DIAGNOSIS — M25611 Stiffness of right shoulder, not elsewhere classified: Secondary | ICD-10-CM | POA: Diagnosis not present

## 2019-08-05 DIAGNOSIS — M25611 Stiffness of right shoulder, not elsewhere classified: Secondary | ICD-10-CM | POA: Diagnosis not present

## 2019-08-05 DIAGNOSIS — M25511 Pain in right shoulder: Secondary | ICD-10-CM | POA: Diagnosis not present

## 2019-08-05 DIAGNOSIS — M25411 Effusion, right shoulder: Secondary | ICD-10-CM | POA: Diagnosis not present

## 2019-08-09 ENCOUNTER — Encounter: Payer: Self-pay | Admitting: Gastroenterology

## 2019-08-09 DIAGNOSIS — M25511 Pain in right shoulder: Secondary | ICD-10-CM | POA: Diagnosis not present

## 2019-08-09 DIAGNOSIS — M25611 Stiffness of right shoulder, not elsewhere classified: Secondary | ICD-10-CM | POA: Diagnosis not present

## 2019-08-09 DIAGNOSIS — M25411 Effusion, right shoulder: Secondary | ICD-10-CM | POA: Diagnosis not present

## 2019-08-12 DIAGNOSIS — M25611 Stiffness of right shoulder, not elsewhere classified: Secondary | ICD-10-CM | POA: Diagnosis not present

## 2019-08-12 DIAGNOSIS — M25511 Pain in right shoulder: Secondary | ICD-10-CM | POA: Diagnosis not present

## 2019-08-12 DIAGNOSIS — M25411 Effusion, right shoulder: Secondary | ICD-10-CM | POA: Diagnosis not present

## 2019-08-17 DIAGNOSIS — M25511 Pain in right shoulder: Secondary | ICD-10-CM | POA: Diagnosis not present

## 2019-08-17 DIAGNOSIS — M25411 Effusion, right shoulder: Secondary | ICD-10-CM | POA: Diagnosis not present

## 2019-08-17 DIAGNOSIS — M25611 Stiffness of right shoulder, not elsewhere classified: Secondary | ICD-10-CM | POA: Diagnosis not present

## 2019-08-18 ENCOUNTER — Encounter: Payer: Self-pay | Admitting: Gastroenterology

## 2019-08-20 DIAGNOSIS — M25511 Pain in right shoulder: Secondary | ICD-10-CM | POA: Diagnosis not present

## 2019-08-20 DIAGNOSIS — M25411 Effusion, right shoulder: Secondary | ICD-10-CM | POA: Diagnosis not present

## 2019-08-20 DIAGNOSIS — M25611 Stiffness of right shoulder, not elsewhere classified: Secondary | ICD-10-CM | POA: Diagnosis not present

## 2019-08-23 DIAGNOSIS — M25611 Stiffness of right shoulder, not elsewhere classified: Secondary | ICD-10-CM | POA: Diagnosis not present

## 2019-08-23 DIAGNOSIS — M25411 Effusion, right shoulder: Secondary | ICD-10-CM | POA: Diagnosis not present

## 2019-08-23 DIAGNOSIS — M25511 Pain in right shoulder: Secondary | ICD-10-CM | POA: Diagnosis not present

## 2019-08-27 DIAGNOSIS — M25611 Stiffness of right shoulder, not elsewhere classified: Secondary | ICD-10-CM | POA: Diagnosis not present

## 2019-08-27 DIAGNOSIS — M25411 Effusion, right shoulder: Secondary | ICD-10-CM | POA: Diagnosis not present

## 2019-08-27 DIAGNOSIS — M25511 Pain in right shoulder: Secondary | ICD-10-CM | POA: Diagnosis not present

## 2019-08-30 DIAGNOSIS — H35371 Puckering of macula, right eye: Secondary | ICD-10-CM | POA: Diagnosis not present

## 2019-09-01 ENCOUNTER — Ambulatory Visit: Payer: PPO | Admitting: Gastroenterology

## 2019-09-01 ENCOUNTER — Other Ambulatory Visit: Payer: Self-pay

## 2019-09-01 ENCOUNTER — Encounter: Payer: Self-pay | Admitting: Gastroenterology

## 2019-09-01 VITALS — BP 134/80 | HR 51 | Temp 97.1°F | Ht 70.0 in | Wt 181.0 lb

## 2019-09-01 DIAGNOSIS — R1013 Epigastric pain: Secondary | ICD-10-CM | POA: Diagnosis not present

## 2019-09-01 DIAGNOSIS — K5909 Other constipation: Secondary | ICD-10-CM

## 2019-09-01 DIAGNOSIS — Z01818 Encounter for other preprocedural examination: Secondary | ICD-10-CM | POA: Diagnosis not present

## 2019-09-01 MED ORDER — PANTOPRAZOLE SODIUM 40 MG PO TBEC: 40.0000 mg | DELAYED_RELEASE_TABLET | Freq: Every day | ORAL | 11 refills | Status: DC

## 2019-09-01 NOTE — Patient Instructions (Signed)
If you are age 82 or older, your body mass index should be between 23-30. Your Body mass index is 25.97 kg/m. If this is out of the aforementioned range listed, please consider follow up with your Primary Care Provider.  If you are age 52 or younger, your body mass index should be between 19-25. Your Body mass index is 25.97 kg/m. If this is out of the aformentioned range listed, please consider follow up with your Primary Care Provider.   You have been scheduled for an endoscopy. Please follow written instructions given to you at your visit today. If you use inhalers (even only as needed), please bring them with you on the day of your procedure. Your physician has requested that you go to www.startemmi.com and enter the access code given to you at your visit today. This web site gives a general overview about your procedure. However, you should still follow specific instructions given to you by our office regarding your preparation for the procedure.  We have sent the following medications to your pharmacy for you to pick up at your convenience: Protonix  .dottiotc Miralax 17grams once daily in coffee.   Thank you,  Dr. Jackquline Denmark

## 2019-09-01 NOTE — Progress Notes (Signed)
Chief Complaint: Abdominal pain  Referring Provider: Dr. Bea Graff      ASSESSMENT AND PLAN;   #1. Epi pain. D/d PUD, GERD, gastritis, nonulcer dyspepsia, gastroparesis, musculoskeletal etiology, r/o biliary or pancreatic problems.  #2.  GERD.  #3.  Chronic constipation.  With element of IBS.  Neg colon 10/2014, CT AP 12/2015   Plan: - Protonix 40mg  po qd, 30, 6 refills - EGD for further evaluation.  Discussed risks and benefits. - Start Miralax 17g po qd in coffee - CBC, CMP, lipase, TSH, vit B12 (had low B12 in past per notes) at time of EGD. - Walk 30 min/day. - Increase water intake. - If still with problems and the above WU is negative, proceed with [place] abdo followed by CT if needed.    HPI:    Steven Valenzuela is a 84 y.o. male   Accompanied by his family Has been complaining of epigastric discomfort along heartburn with some odynophagia but no dysphagia x 6 months.  Getting worse.  Very much concerned about upper GI malignancies.  Could not identify any definite exacerbating or relieving factors except for Maalox on as-needed basis.  He would also use Tums on as needed basis.  Has generalized abdominal bloating with chronic constipation.  Bowel movements at the frequency of once per week.  Has lower abdominal discomfort which does get better with defecation.  No melena or hematochezia.  No significant weight loss.  No jaundice dark urine or pale stools.  No sodas, chocolates, chewing gums, artificial sweeteners and candy. No NSAIDs  Had severe diarrhea with abdominal pain after eating salad May 2017.  Evaluated by Dr Dolphus Jenny at [facility].  Had a negative CT except for "constipation".  Stool studies were negative.  He had normal CBC, CMP.   Previous GI procedures: -Colonoscopy 10/2014: 6 mm colonic polyp s/p polypectomy (serrated adenoma), mild sigmoid diverticulosis.  Highly redundant colon.  Recommended to repeat only if any new problems. -CT AP with contrast 12/2015: neg  for any acute abnormalities.  Stool-filled colon c/w constipation, extensive atherosclerosis. -EGD 12/2018: Gastroduodenitis. Bx-neg CLO testing. -Barium enema 04/1998: Highly redundant colon. -[place] 11/2012: neg, HIDA scan ejection fraction negative Past Medical History:  Diagnosis Date  . Diverticulosis   . Gastroduodenitis   . GERD (gastroesophageal reflux disease)   . Hypertension   . Internal hemorrhoids   . Thyroid disease     Past Surgical History:  Procedure Laterality Date  . COLONOSCOPY  10/10/2014   Colonic post status polypectomy. Mild predominantly sigmoid diverticulosis. Otherwise normal colonoscopt to TI.  Marland Kitchen ESOPHAGOGASTRODUODENOSCOPY  10/20/2008   Irregular Zline suggestive of GERD. Otherwise normal EGD.    History reviewed. No pertinent family history.  Social History   Tobacco Use  . Smoking status: Never Smoker  . Smokeless tobacco: Never Used  Substance Use Topics  . Alcohol use: Not Currently  . Drug use: Never    Current Outpatient Medications  Medication Sig Dispense Refill  . atenolol (TENORMIN) 25 MG tablet Take by mouth daily.    Marland Kitchen levothyroxine (SYNTHROID) 75 MCG tablet Take 75 mcg by mouth daily before breakfast.     No current facility-administered medications for this visit.    Allergies  Allergen Reactions  . Bactrim [Sulfamethoxazole-Trimethoprim]   . Prednisone   . Sulfa Antibiotics     Review of Systems:  Constitutional: Denies fever, chills, diaphoresis, appetite change and fatigue.  HEENT: Denies photophobia, eye pain, redness, hearing loss, ear pain, congestion, sore throat, rhinorrhea,  sneezing, mouth sores, neck pain, neck stiffness and tinnitus.   Respiratory: Denies SOB, DOE, cough, chest tightness,  and wheezing.   Cardiovascular: Denies chest pain, palpitations and leg swelling.  Genitourinary: Denies dysuria, urgency, frequency, hematuria, flank pain and difficulty urinating.  Musculoskeletal: Has  myalgias, back pain, joint  swelling, arthralgias and gait problem.  Skin: No rash.  Neurological: Denies dizziness, seizures, syncope, weakness, light-headedness, numbness and headaches.  Hematological: Denies adenopathy. Easy bruising, personal or family bleeding history  Psychiatric/Behavioral: Has anxiety or depression     Physical Exam:    BP 134/80   Pulse (!) 51   Temp (!) 97.1 F (36.2 C)   Ht 5\' 10"  (1.778 m)   Wt 181 lb (82.1 kg)   BMI 25.97 kg/m  Wt Readings from Last 3 Encounters:  09/01/19 181 lb (82.1 kg)  10/30/17 175 lb (79.4 kg)   Constitutional: Thin built, in no acute distress. Psychiatric: Normal mood and affect. Behavior is normal. HEENT: Pupils normal.  Conjunctivae are normal. No scleral icterus. Neck supple.  Cardiovascular: Normal rate, regular rhythm. No edema Pulmonary/chest: Effort normal and breath sounds normal. No wheezing, rales or rhonchi. Abdominal: Soft, nondistended.  Mild epigastric tenderness bowel sounds active throughout. There are no masses palpable. No hepatomegaly. Rectal:  defered Neurological: Alert and oriented to person place and time. Skin: Skin is warm and dry. No rashes noted.     Steven Austria, MD 09/01/2019, 2:11 PM  Cc: Dr. Bea Graff

## 2019-09-03 DIAGNOSIS — M25411 Effusion, right shoulder: Secondary | ICD-10-CM | POA: Diagnosis not present

## 2019-09-03 DIAGNOSIS — M25511 Pain in right shoulder: Secondary | ICD-10-CM | POA: Diagnosis not present

## 2019-09-03 DIAGNOSIS — M25611 Stiffness of right shoulder, not elsewhere classified: Secondary | ICD-10-CM | POA: Diagnosis not present

## 2019-09-07 ENCOUNTER — Encounter: Payer: Self-pay | Admitting: Gastroenterology

## 2019-09-15 ENCOUNTER — Other Ambulatory Visit: Payer: Self-pay

## 2019-09-15 ENCOUNTER — Other Ambulatory Visit: Payer: Self-pay | Admitting: Gastroenterology

## 2019-09-15 ENCOUNTER — Ambulatory Visit (INDEPENDENT_AMBULATORY_CARE_PROVIDER_SITE_OTHER): Payer: PPO

## 2019-09-15 ENCOUNTER — Other Ambulatory Visit (INDEPENDENT_AMBULATORY_CARE_PROVIDER_SITE_OTHER): Payer: PPO

## 2019-09-15 DIAGNOSIS — R1013 Epigastric pain: Secondary | ICD-10-CM | POA: Diagnosis not present

## 2019-09-15 DIAGNOSIS — Z1159 Encounter for screening for other viral diseases: Secondary | ICD-10-CM

## 2019-09-15 DIAGNOSIS — K5909 Other constipation: Secondary | ICD-10-CM

## 2019-09-15 LAB — COMPREHENSIVE METABOLIC PANEL
ALT: 12 U/L (ref 0–53)
AST: 16 U/L (ref 0–37)
Albumin: 4 g/dL (ref 3.5–5.2)
Alkaline Phosphatase: 50 U/L (ref 39–117)
BUN: 18 mg/dL (ref 6–23)
CO2: 31 mEq/L (ref 19–32)
Calcium: 9.6 mg/dL (ref 8.4–10.5)
Chloride: 105 mEq/L (ref 96–112)
Creatinine, Ser: 1.23 mg/dL (ref 0.40–1.50)
GFR: 56.42 mL/min — ABNORMAL LOW (ref >60.00)
Glucose, Bld: 86 mg/dL (ref 70–99)
Potassium: 4.1 mEq/L (ref 3.5–5.1)
Sodium: 140 mEq/L (ref 135–145)
Total Bilirubin: 0.7 mg/dL (ref 0.2–1.2)
Total Protein: 7 g/dL (ref 6.0–8.3)

## 2019-09-15 LAB — CBC WITH DIFFERENTIAL/PLATELET
Basophils Absolute: 0 10*3/uL (ref 0.0–0.1)
Basophils Relative: 0.6 % (ref 0.0–3.0)
Eosinophils Absolute: 0.1 10*3/uL (ref 0.0–0.7)
Eosinophils Relative: 1.4 % (ref 0.0–5.0)
HCT: 43.3 % (ref 39.0–52.0)
Hemoglobin: 14.4 g/dL (ref 13.0–17.0)
Lymphocytes Relative: 22.1 % (ref 12.0–46.0)
Lymphs Abs: 1.7 10*3/uL (ref 0.7–4.0)
MCHC: 33.3 g/dL (ref 30.0–36.0)
MCV: 93.6 fl (ref 78.0–100.0)
Monocytes Absolute: 0.8 10*3/uL (ref 0.1–1.0)
Monocytes Relative: 10.1 % (ref 3.0–12.0)
Neutro Abs: 5 10*3/uL (ref 1.4–7.7)
Neutrophils Relative %: 65.8 % (ref 43.0–77.0)
Platelets: 191 10*3/uL (ref 150.0–400.0)
RBC: 4.63 Mil/uL (ref 4.22–5.81)
RDW: 14 % (ref 11.5–15.5)
WBC: 7.6 10*3/uL (ref 4.0–10.5)

## 2019-09-15 LAB — TSH: TSH: 4.39 u[IU]/mL (ref 0.35–4.50)

## 2019-09-15 LAB — VITAMIN B12: Vitamin B-12: 324 pg/mL (ref 211–911)

## 2019-09-16 LAB — SARS CORONAVIRUS 2 (TAT 6-24 HRS): SARS Coronavirus 2: NEGATIVE

## 2019-09-17 ENCOUNTER — Other Ambulatory Visit: Payer: Self-pay

## 2019-09-17 ENCOUNTER — Telehealth: Payer: Self-pay | Admitting: Gastroenterology

## 2019-09-17 ENCOUNTER — Ambulatory Visit (AMBULATORY_SURGERY_CENTER): Payer: PPO | Admitting: Gastroenterology

## 2019-09-17 ENCOUNTER — Encounter: Payer: Self-pay | Admitting: Gastroenterology

## 2019-09-17 VITALS — BP 111/72 | HR 72 | Temp 97.3°F | Resp 16 | Ht 70.0 in | Wt 181.0 lb

## 2019-09-17 DIAGNOSIS — K2950 Unspecified chronic gastritis without bleeding: Secondary | ICD-10-CM

## 2019-09-17 DIAGNOSIS — K253 Acute gastric ulcer without hemorrhage or perforation: Secondary | ICD-10-CM

## 2019-09-17 DIAGNOSIS — R1013 Epigastric pain: Secondary | ICD-10-CM | POA: Diagnosis not present

## 2019-09-17 MED ORDER — ESOMEPRAZOLE MAGNESIUM 40 MG PO PACK: 40.0000 mg | PACK | Freq: Every day | ORAL | 11 refills | Status: AC

## 2019-09-17 MED ORDER — SODIUM CHLORIDE 0.9 % IV SOLN: 500.0000 mL | Freq: Once | INTRAVENOUS | Status: DC

## 2019-09-17 NOTE — Telephone Encounter (Signed)
Conti.. the rx for  Nexium

## 2019-09-17 NOTE — Op Note (Signed)
[facility] Patient Name: Steven Valenzuela Procedure Date: 09/17/2019 9:03 AM MRN: [id number] Endoscopist: Jackquline Denmark , MD Age: 84 Referring MD:  Date of Birth: June 03, 1936 Gender: Male Account #: [id number] Procedure:                Upper GI endoscopy Indications:              Epigastric abdominal pain Medicines:                Monitored Anesthesia Care Procedure:                Pre-Anesthesia Assessment:                           - Prior to the procedure, a History and Physical                            was performed, and patient medications and                            allergies were reviewed. The patient's tolerance of                            previous anesthesia was also reviewed. The risks                            and benefits of the procedure and the sedation                            options and risks were discussed with the patient.                            All questions were answered, and informed consent                            was obtained. Prior Anticoagulants: The patient has                            taken no previous anticoagulant or antiplatelet                            agents. ASA Grade Assessment: III - A patient with                            severe systemic disease. After reviewing the risks                            and benefits, the patient was deemed in                            satisfactory condition to undergo the procedure.                           After obtaining informed consent, the endoscope was  passed under direct vision. Throughout the                            procedure, the patient's blood pressure, pulse, and                            oxygen saturations were monitored continuously. The                            Endoscope was introduced through the mouth, and                            advanced to the second part of duodenum. The upper                            GI endoscopy was accomplished  without difficulty.                            The patient tolerated the procedure well. Scope In: Scope Out: Findings:                 The examined esophagus was normal. Z-line                            well-defined at 40 cm.                           A few localized small erosions with no bleeding and                            no stigmata of recent bleeding were found in the                            gastric body. Mild antral gastritis in form of                            erythema. Biopsies were taken with a cold forceps                            for histology.                           The examined duodenum was normal. Complications:            No immediate complications. Estimated Blood Loss:     Estimated blood loss: none. Impression:               -Erosive gastritis. Recommendation:           - Resume previous diet.                           - Switch to Nexium (esomeprazole) 40 mg PO daily                            #30, 11 refiils.                           -  No ibuprofen, naproxen, or other non-steroidal                            anti-inflammatory drugs.                           - Return to GI office in 8 weeks.                           - I have reviewed labs - CBC, CMP, TSH and B12 were                            within normal limits.                           - D/W Rich Number, MD 09/17/2019 9:23:47 AM This report has been signed electronically.

## 2019-09-17 NOTE — Telephone Encounter (Signed)
Called pharmacy and [facility] for capsules not packets (entered in error)

## 2019-09-17 NOTE — Progress Notes (Signed)
  Temp  LC  VS  DT  Pt's states no medical or surgical changes since previsit or office visit.    

## 2019-09-17 NOTE — Patient Instructions (Addendum)
TAKE NEXIUM 40MG  BY MOUTH EVERYDAY, 30 MINUTES BEFORE BREAKFAST ON AN EMPTY STOMACH. TAKE MILK OF MAGNESIA EVERYDAY FOR CONSTIPATION. DR Steve Rattler OFFICE WILL CALL YOU TO SCHEDULE FOLLOW-UP APPOINTMENT. NO IBUPROFEN, NAPROXEN, OR OTHER NON-STEROIDAL ANTI-INFAMMATORY DRUGS.  TAKE TYLENOL IF YOU NEED SOMETHING OTC FOR PAIN.     YOU HAD AN ENDOSCOPIC PROCEDURE TODAY AT [facility]:   Refer to the procedure report that was given to you for any specific questions about what was found during the examination.  If the procedure report does not answer your questions, please call your gastroenterologist to clarify.  If you requested that your care partner not be given the details of your procedure findings, then the procedure report has been included in a sealed envelope for you to review at your convenience later.  YOU SHOULD EXPECT: Some feelings of bloating in the abdomen. Passage of more gas than usual.  Walking can help get rid of the air that was put into your GI tract during the procedure and reduce the bloating. If you had a lower endoscopy (such as a colonoscopy or flexible sigmoidoscopy) you may notice spotting of blood in your stool or on the toilet paper. If you underwent a bowel prep for your procedure, you may not have a normal bowel movement for a few days.  Please Note:  You might notice some irritation and congestion in your nose or some drainage.  This is from the oxygen used during your procedure.  There is no need for concern and it should clear up in a day or so.  SYMPTOMS TO REPORT IMMEDIATELY:     Following upper endoscopy (EGD)  Vomiting of blood or coffee ground material  New chest pain or pain under the shoulder blades  Painful or persistently difficult swallowing  New shortness of breath  Fever of 100F or higher  Black, tarry-looking stools  For urgent or emergent issues, a gastroenterologist can be reached at any hour by calling [phone].   DIET:   We do recommend a small meal at first, but then you may proceed to your regular diet.  Drink plenty of fluids but you should avoid alcoholic beverages for 24 hours.  ACTIVITY:  You should plan to take it easy for the rest of today and you should NOT DRIVE or use heavy machinery until tomorrow (because of the sedation medicines used during the test).    FOLLOW UP: Our staff will call the number listed on your records 48-72 hours following your procedure to check on you and address any questions or concerns that you may have regarding the information given to you following your procedure. If we do not reach you, we will leave a message.  We will attempt to reach you two times.  During this call, we will ask if you have developed any symptoms of COVID 19. If you develop any symptoms (ie: fever, flu-like symptoms, shortness of breath, cough etc.) before then, please call [phone].  If you test positive for Covid 19 in the 2 weeks post procedure, please call and report this information to [place].    If any biopsies were taken you will be contacted by phone or by letter within the next 1-3 weeks.  Please call us at [phone] if you have not heard about the biopsies in 3 weeks.    SIGNATURES/CONFIDENTIALITY: You and/or your care partner have signed paperwork which will be entered into your electronic medical record.  These signatures attest to the fact  that that the information above on your After Visit Summary has been reviewed and is understood.  Full responsibility of the confidentiality of this discharge information lies with you and/or your care-partner.

## 2019-09-17 NOTE — Progress Notes (Signed)
A/ox3, pleased with MAC, report to RN 

## 2019-09-21 ENCOUNTER — Telehealth: Payer: Self-pay

## 2019-09-21 NOTE — Telephone Encounter (Signed)
  Follow up Call-  Call back number 09/17/2019  Post procedure Call Back phone  # [phone]  Permission to leave phone message Yes  Some recent data might be hidden     Patient questions:  Do you have a fever, pain , or abdominal swelling? No. Pain Score  0 *  Have you tolerated food without any problems? Yes.    Have you been able to return to your normal activities? Yes.    Do you have any questions about your discharge instructions: Diet   No. Medications  No. Follow up visit  No.  Do you have questions or concerns about your Care? No.  Actions: * If pain score is 4 or above: No action needed, pain <4. 1. Have you developed a fever since your procedure? no  2.   Have you had an respiratory symptoms (SOB or cough) since your procedure? no  3.   Have you tested positive for COVID 19 since your procedure no  4.   Have you had any family members/close contacts diagnosed with the COVID 19 since your procedure?  no   If yes to any of these questions please route to Joylene John, RN and Alphonsa Gin, Therapist, sports.

## 2019-09-25 ENCOUNTER — Encounter: Payer: Self-pay | Admitting: Gastroenterology

## 2019-09-26 DIAGNOSIS — K2950 Unspecified chronic gastritis without bleeding: Secondary | ICD-10-CM

## 2020-01-06 IMAGING — CR DG THORACIC SPINE 2V
3 series · 3 of 3 positions shown · non-contrast
Comparison: None.  Chest radiograph June 15, 2017

CLINICAL DATA: Dorsalgia

EXAM:
THORACIC SPINE 3 VIEWS

[t-spine ap]
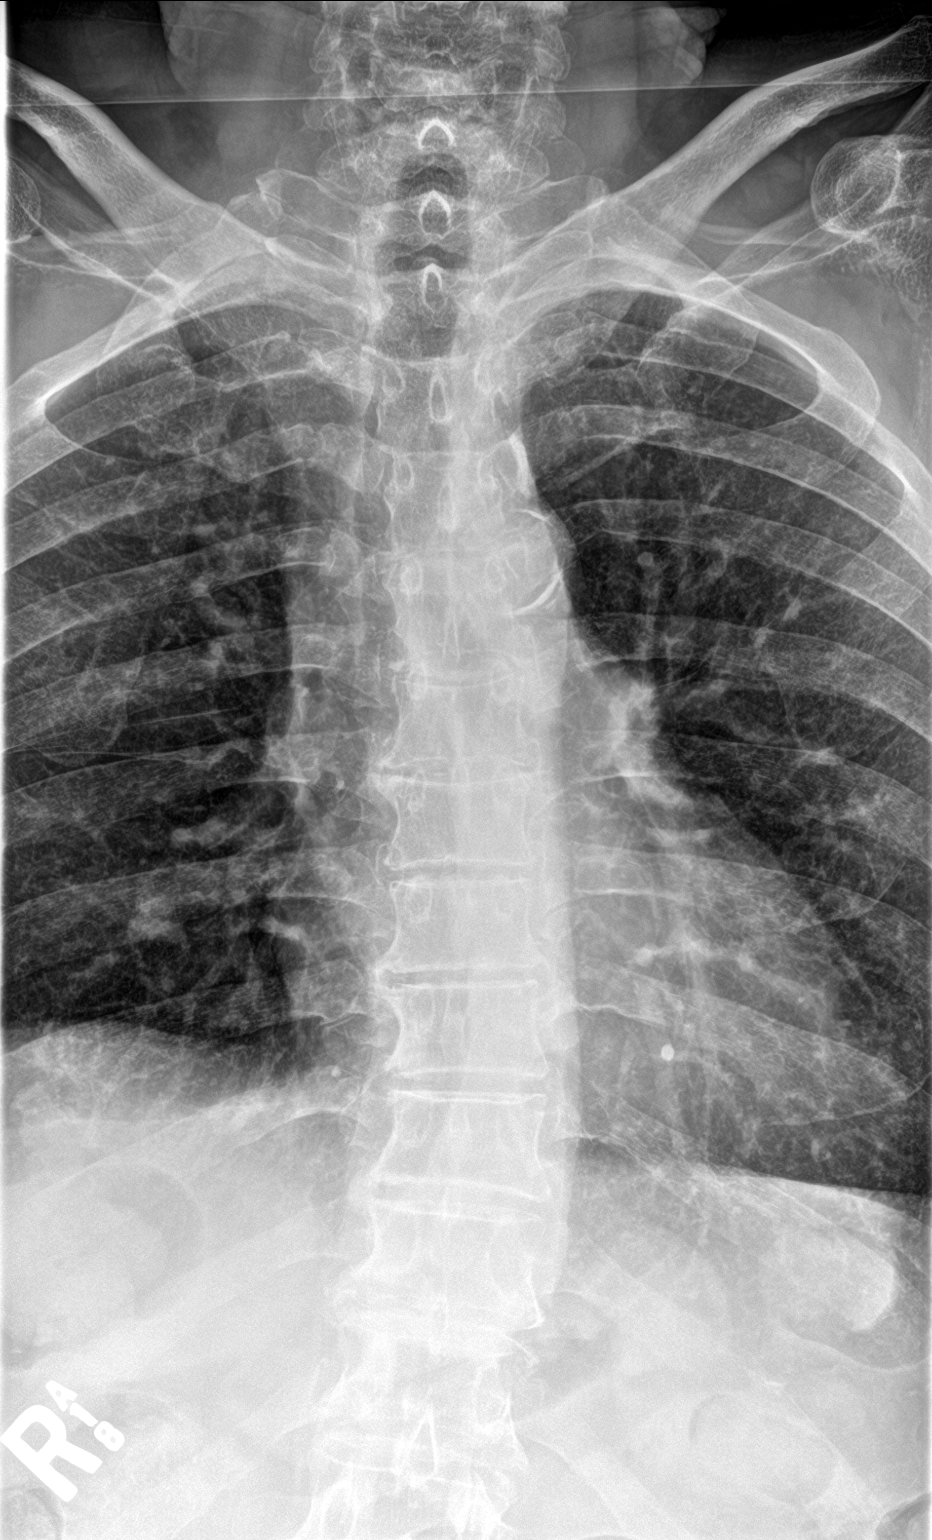

[t-spine lat]
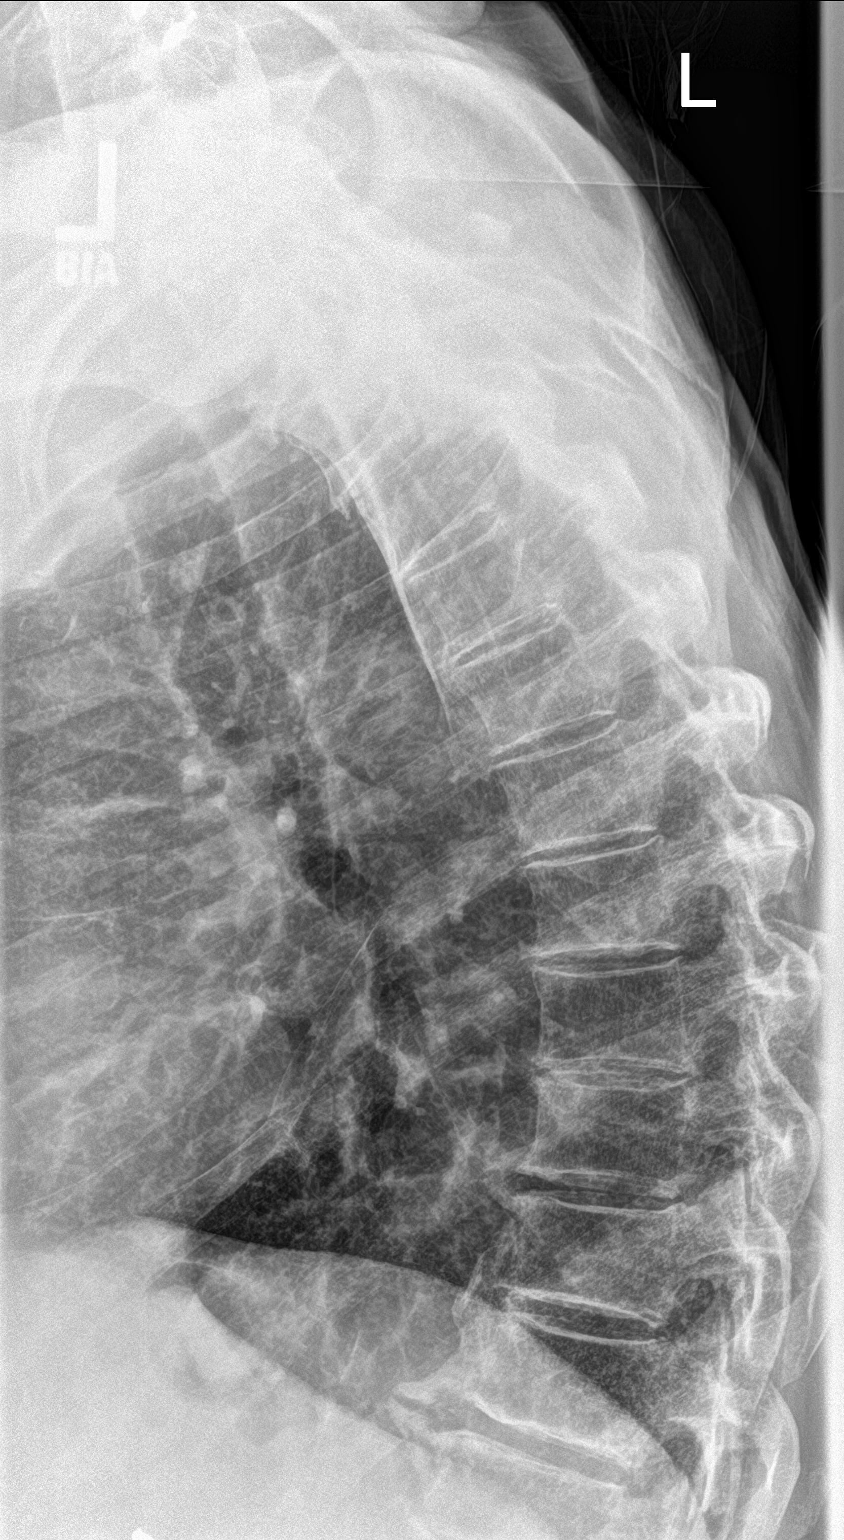

[t-spine swimmers]
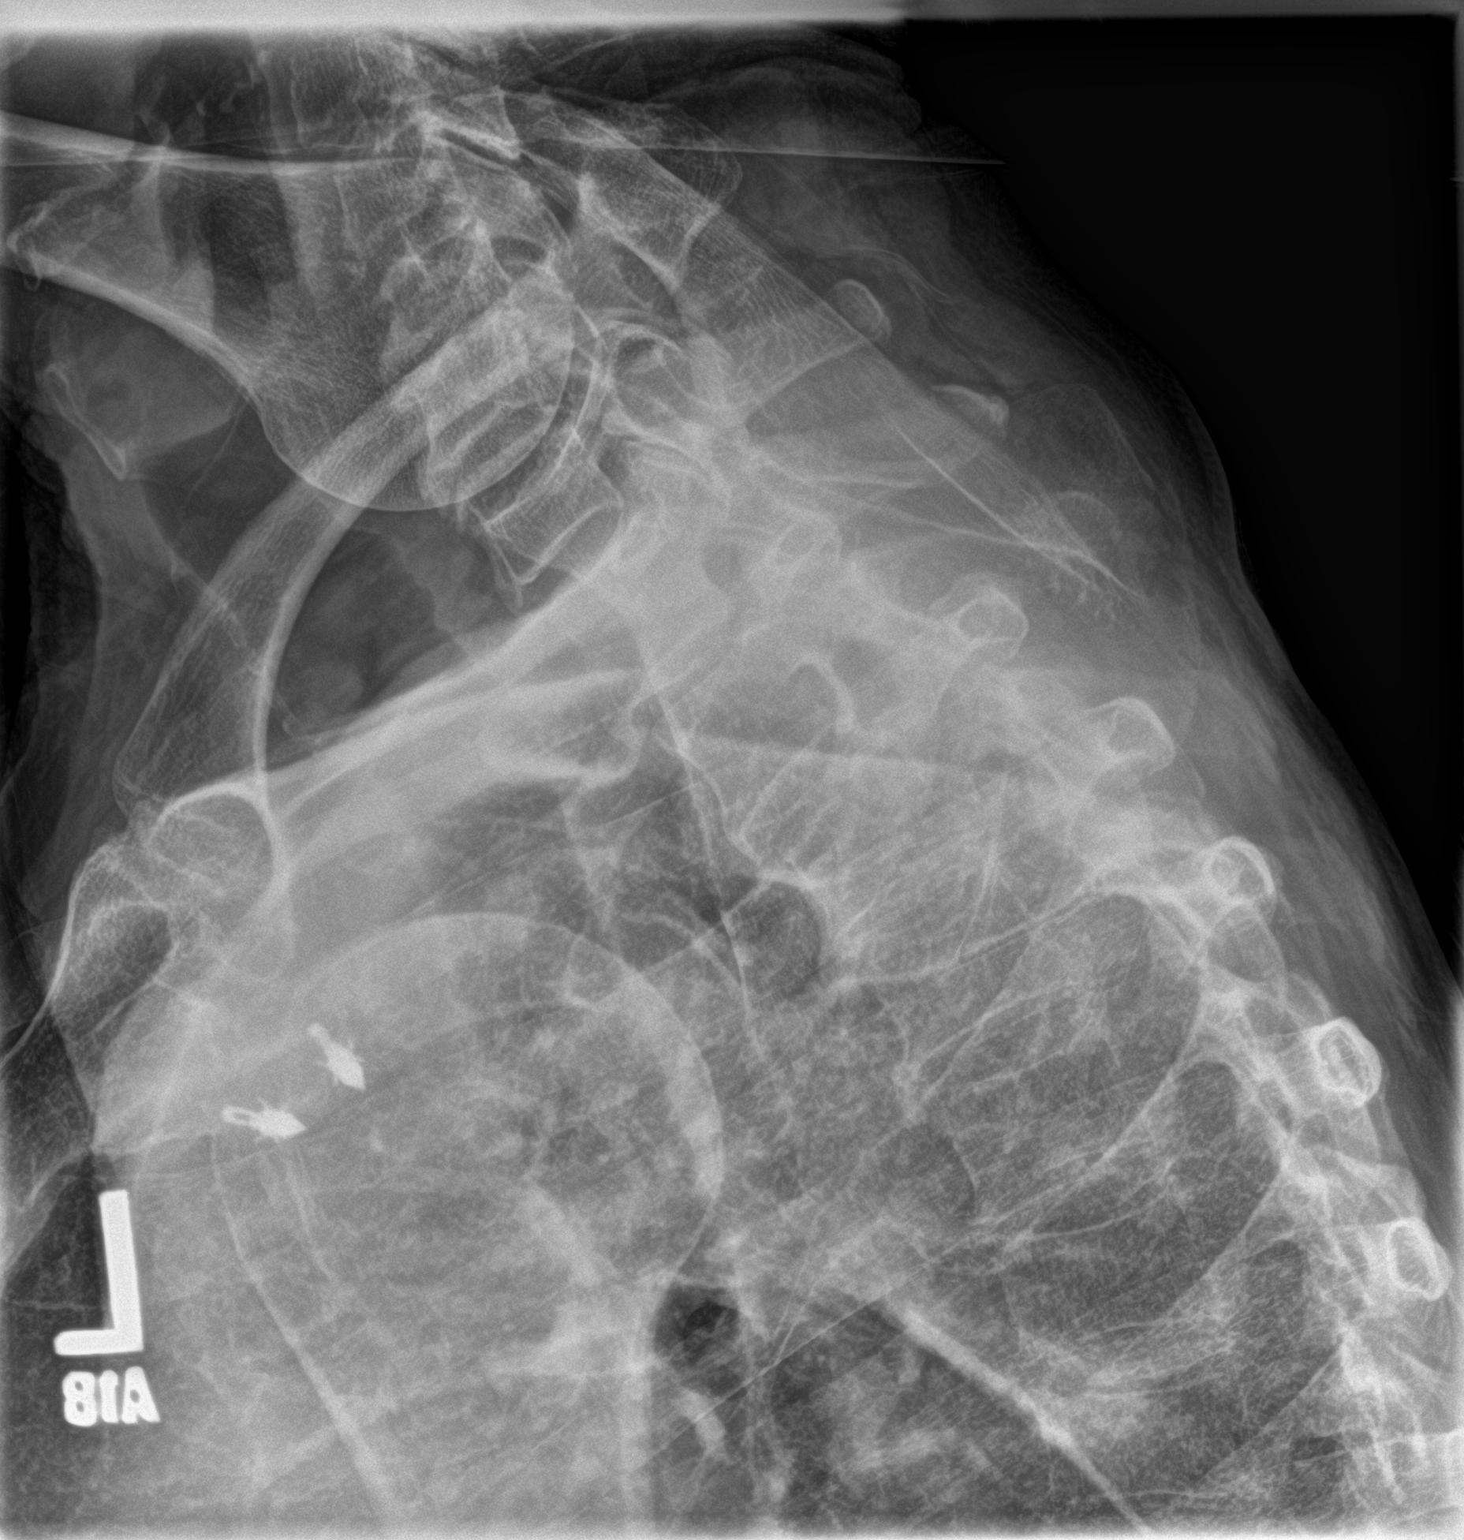

[3 of 3 positions shown; findings below may reference images not displayed]

FINDINGS: Frontal, lateral, and swimmer's views were obtained. There is lower
thoracic levoscoliosis. No fracture or spondylolisthesis. There is
mild disc space narrowing at several levels. No erosive change or
paraspinous lesion. There is aortic atherosclerosis.
IMPRESSION: Mild scoliosis in the lower thoracic region. Osteoarthritic change
noted at several levels. No fracture or spondylolisthesis. There is
aortic atherosclerosis.

Aortic Atherosclerosis ([id number]).

## 2020-01-06 IMAGING — DX DG LUMBAR SPINE COMPLETE 4+V
5 series · 5 of 5 positions shown · non-contrast
Comparison: CT 12/13/2015.

CLINICAL DATA: Low back pain.  No known injury.

EXAM:
LUMBAR SPINE - COMPLETE 4+ VIEW

[l-spine ap]
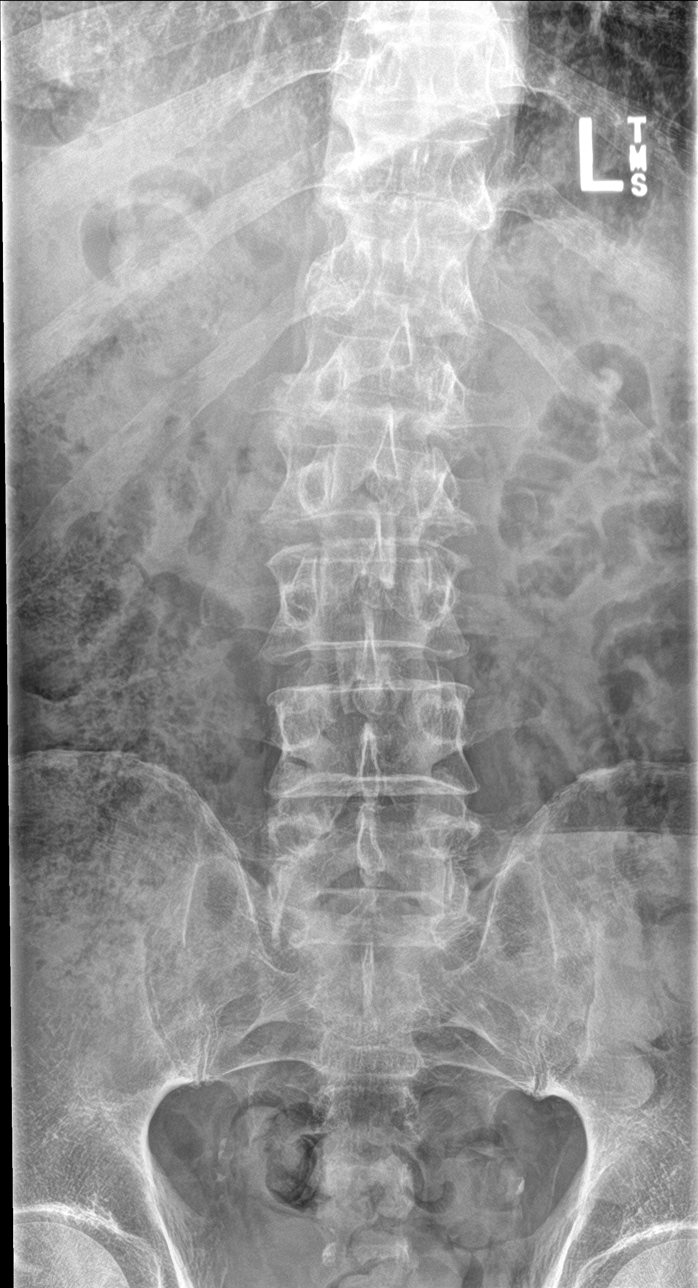

[l-spine obl (1 of 2)]
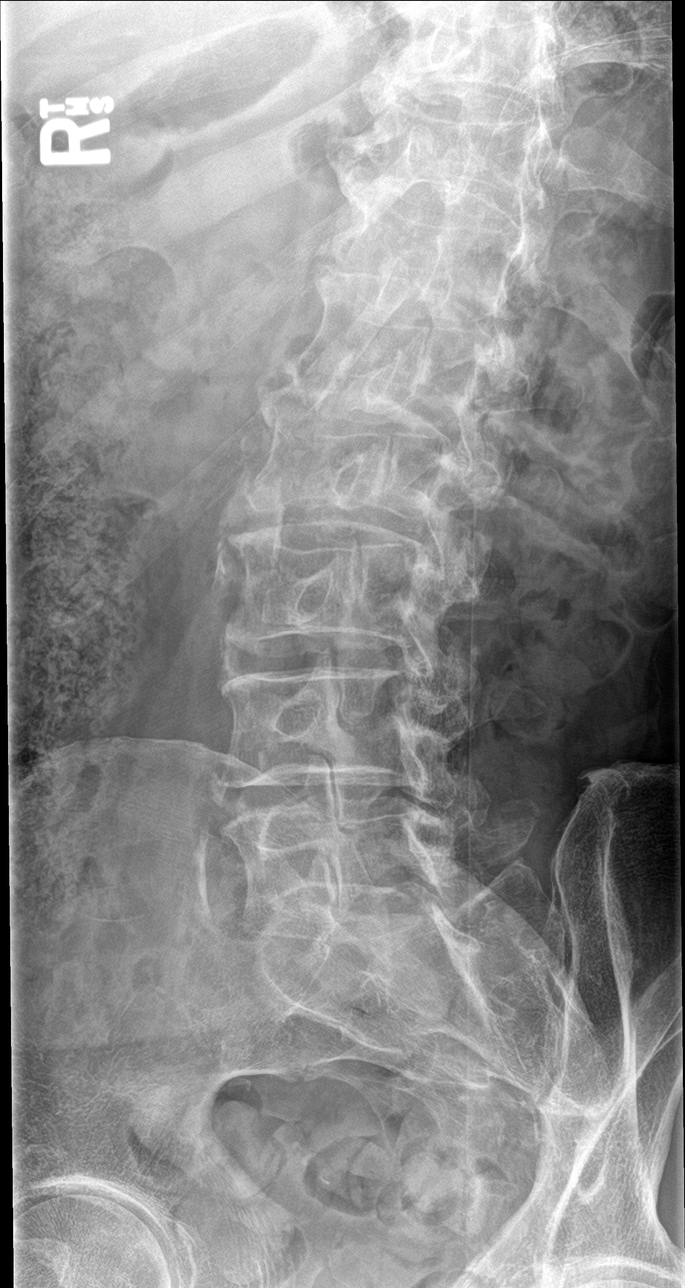

[l-spine obl (2 of 2)]
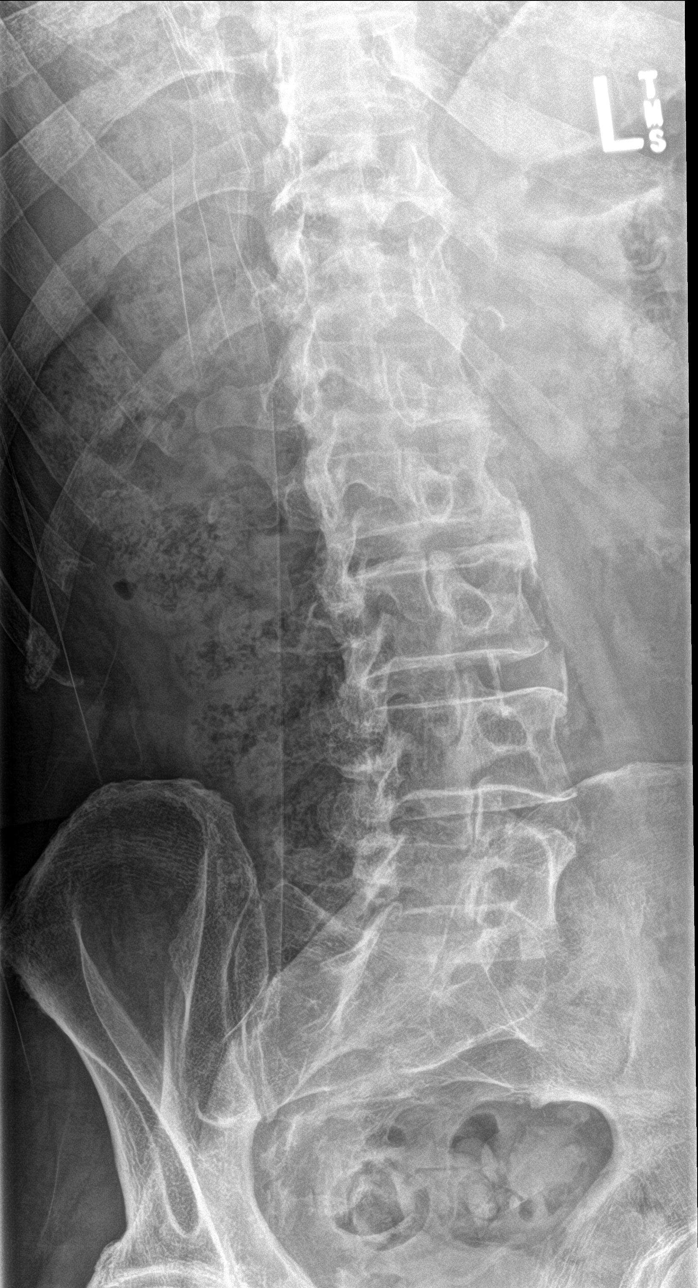

[l-spine lat]
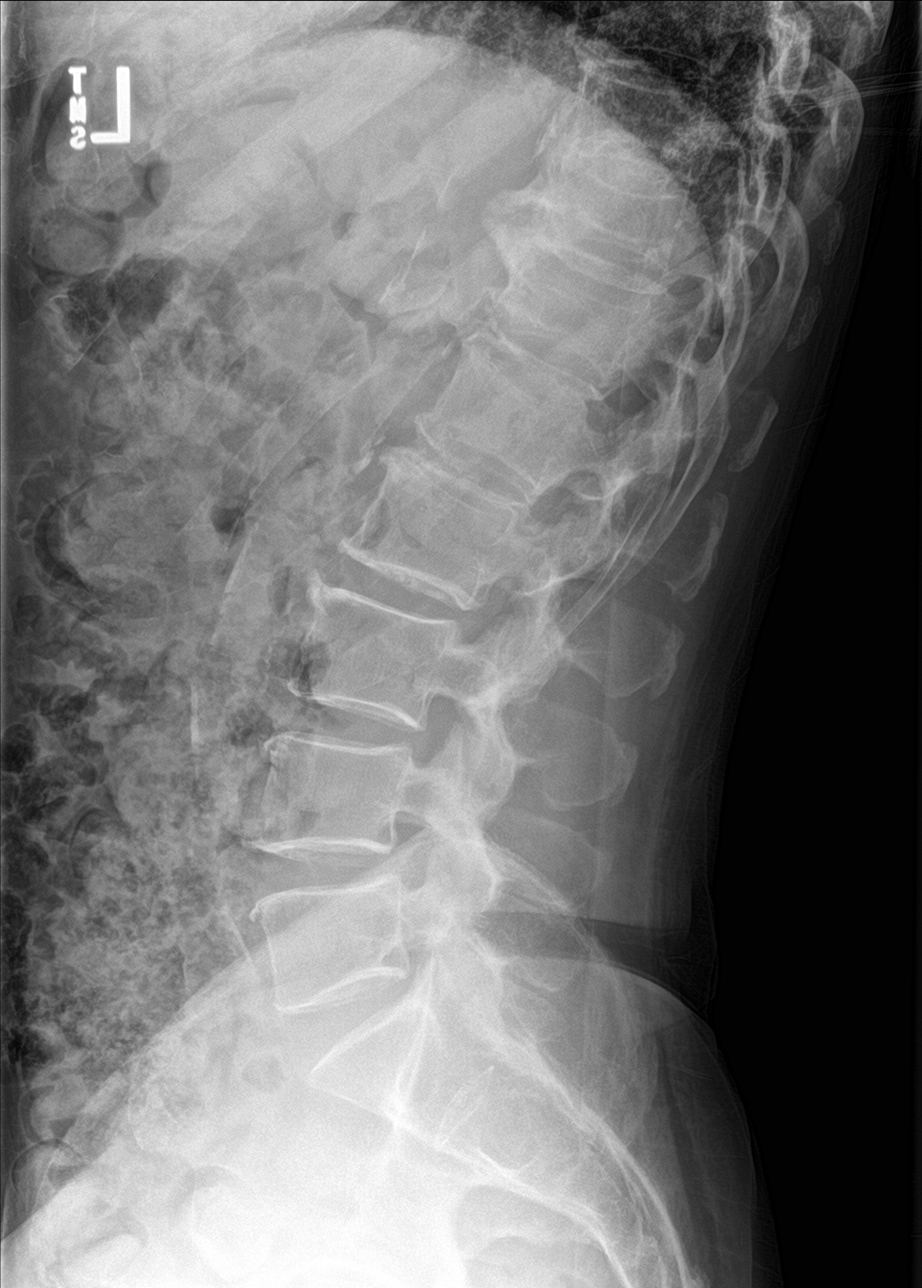

[l-spine spot]
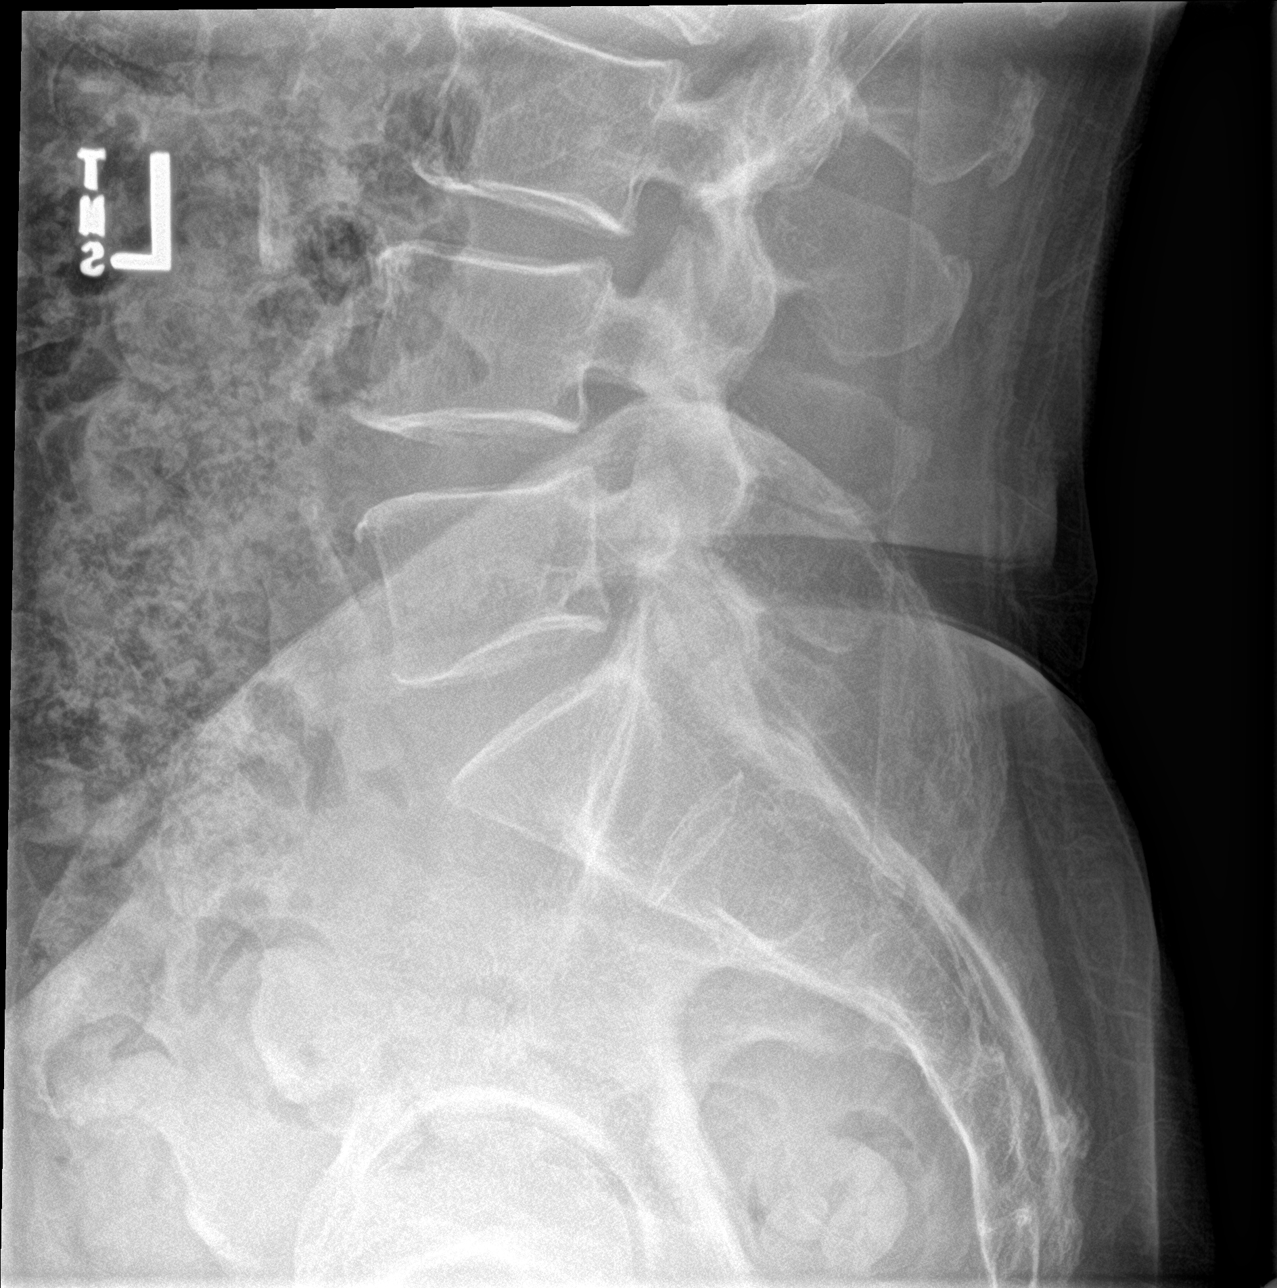

[5 of 5 positions shown; findings below may reference images not displayed]

FINDINGS: Lumbar scoliosis concave left. Minimal compressions of T11, T12, and
L1 cannot be completely excluded. Perceived minimal compressions may
just be related scoliosis. No prominent compression fracture. No
other focal abnormality identified. Diffuse multilevel degenerative
change. Aortoiliac atherosclerotic vascular calcification.
IMPRESSION: 1. Lumbar spine scoliosis concave left. Diffuse multilevel
degenerative change.

2. Minimal compressions of T11, T12, and L1 cannot be completely
excluded. Perceived minimal compression fractures may just be
related to scoliosis. No prominent compression fracture.

3.  Aortoiliac atherosclerotic vascular disease.

## 2020-01-24 DIAGNOSIS — E039 Hypothyroidism, unspecified: Secondary | ICD-10-CM | POA: Diagnosis not present

## 2020-01-24 DIAGNOSIS — E7800 Pure hypercholesterolemia, unspecified: Secondary | ICD-10-CM | POA: Diagnosis not present

## 2020-01-24 DIAGNOSIS — E538 Deficiency of other specified B group vitamins: Secondary | ICD-10-CM | POA: Diagnosis not present

## 2020-01-24 DIAGNOSIS — I10 Essential (primary) hypertension: Secondary | ICD-10-CM | POA: Diagnosis not present

## 2020-01-24 DIAGNOSIS — K219 Gastro-esophageal reflux disease without esophagitis: Secondary | ICD-10-CM | POA: Diagnosis not present

## 2020-06-26 DIAGNOSIS — E7800 Pure hypercholesterolemia, unspecified: Secondary | ICD-10-CM | POA: Diagnosis not present

## 2020-06-26 DIAGNOSIS — M5030 Other cervical disc degeneration, unspecified cervical region: Secondary | ICD-10-CM | POA: Diagnosis not present

## 2020-06-26 DIAGNOSIS — E538 Deficiency of other specified B group vitamins: Secondary | ICD-10-CM | POA: Diagnosis not present

## 2020-06-26 DIAGNOSIS — Z0000 Encounter for general adult medical examination without abnormal findings: Secondary | ICD-10-CM | POA: Diagnosis not present

## 2020-06-26 DIAGNOSIS — I10 Essential (primary) hypertension: Secondary | ICD-10-CM | POA: Diagnosis not present

## 2020-06-26 DIAGNOSIS — E039 Hypothyroidism, unspecified: Secondary | ICD-10-CM | POA: Diagnosis not present

## 2020-06-26 DIAGNOSIS — I129 Hypertensive chronic kidney disease with stage 1 through stage 4 chronic kidney disease, or unspecified chronic kidney disease: Secondary | ICD-10-CM | POA: Diagnosis not present

## 2020-06-26 DIAGNOSIS — I739 Peripheral vascular disease, unspecified: Secondary | ICD-10-CM | POA: Diagnosis not present

## 2020-06-26 DIAGNOSIS — Z23 Encounter for immunization: Secondary | ICD-10-CM | POA: Diagnosis not present

## 2020-06-26 DIAGNOSIS — D649 Anemia, unspecified: Secondary | ICD-10-CM

## 2020-06-26 DIAGNOSIS — Z125 Encounter for screening for malignant neoplasm of prostate: Secondary | ICD-10-CM | POA: Diagnosis not present

## 2020-06-26 DIAGNOSIS — G629 Polyneuropathy, unspecified: Secondary | ICD-10-CM | POA: Diagnosis not present

## 2020-06-26 DIAGNOSIS — I6523 Occlusion and stenosis of bilateral carotid arteries: Secondary | ICD-10-CM | POA: Diagnosis not present

## 2020-06-26 DIAGNOSIS — K219 Gastro-esophageal reflux disease without esophagitis: Secondary | ICD-10-CM | POA: Diagnosis not present

## 2020-06-26 DIAGNOSIS — N1832 Chronic kidney disease, stage 3b: Secondary | ICD-10-CM

## 2020-06-26 HISTORY — DX: Chronic kidney disease, stage 3b: N18.32

## 2020-06-26 HISTORY — DX: Anemia, unspecified: D64.9

## 2020-07-05 DIAGNOSIS — G5793 Unspecified mononeuropathy of bilateral lower limbs: Secondary | ICD-10-CM | POA: Diagnosis not present

## 2020-07-05 DIAGNOSIS — I739 Peripheral vascular disease, unspecified: Secondary | ICD-10-CM | POA: Diagnosis not present

## 2020-07-22 DIAGNOSIS — S63601A Unspecified sprain of right thumb, initial encounter: Secondary | ICD-10-CM | POA: Diagnosis not present

## 2020-08-01 DIAGNOSIS — Z8711 Personal history of peptic ulcer disease: Secondary | ICD-10-CM

## 2020-08-01 DIAGNOSIS — N1832 Chronic kidney disease, stage 3b: Secondary | ICD-10-CM | POA: Diagnosis not present

## 2020-08-01 DIAGNOSIS — D649 Anemia, unspecified: Secondary | ICD-10-CM | POA: Diagnosis not present

## 2020-08-01 HISTORY — DX: Personal history of peptic ulcer disease: Z87.11

## 2020-08-28 ENCOUNTER — Encounter: Payer: Self-pay | Admitting: Gastroenterology

## 2020-08-28 ENCOUNTER — Ambulatory Visit: Payer: PPO | Admitting: Gastroenterology

## 2020-08-28 ENCOUNTER — Other Ambulatory Visit (INDEPENDENT_AMBULATORY_CARE_PROVIDER_SITE_OTHER): Payer: PPO

## 2020-08-28 ENCOUNTER — Other Ambulatory Visit: Payer: Self-pay

## 2020-08-28 VITALS — BP 150/82 | HR 65 | Ht 70.5 in | Wt 181.1 lb

## 2020-08-28 DIAGNOSIS — K219 Gastro-esophageal reflux disease without esophagitis: Secondary | ICD-10-CM

## 2020-08-28 DIAGNOSIS — K5909 Other constipation: Secondary | ICD-10-CM | POA: Diagnosis not present

## 2020-08-28 DIAGNOSIS — D649 Anemia, unspecified: Secondary | ICD-10-CM

## 2020-08-28 NOTE — Patient Instructions (Signed)
If you are age 83 or older, your body mass index should be between 23-30. Your Body mass index is 25.62 kg/m. If this is out of the aforementioned range listed, please consider follow up with your Primary Care Provider.  If you are age 34 or younger, your body mass index should be between 19-25. Your Body mass index is 25.62 kg/m. If this is out of the aformentioned range listed, please consider follow up with your Primary Care Provider.   Please go to the lab on the 2nd floor [address] before you leave the office today.   Please purchase the following medications over the counter and take as directed: Miralax 17 grams once daily in coffee.   Thank you,  Dr. Jackquline Denmark

## 2020-08-28 NOTE — Progress Notes (Signed)
Chief Complaint: FU  Referring Provider: Dr. Bea Graff      ASSESSMENT AND PLAN;   #1.  Mild anemia with heme neg stools. Neg EGD except for erosive gastritis 09/2019.  Could be related to CKD.  #2.  GERD.  #3.  Chronic constipation.  With element of IBS.  Neg colon 10/2014, CT AP 12/2015   Plan:  - Change Nexium 40mg  po qod, 30, 6 refills - CBC, CMP, iron studies, celiac screen. - If still with anemia, then he would consider colonoscopy. - Continue Miralax 17g po qd in coffee - Walk 30 min/day. - Increase water intake. - If still with problems and the above WU is neg, proceed with CT AP, followed by hematology consultation, if needed.    HPI:    Steven Valenzuela is a 85 y.o. male   With CKD3, HTN, hypothyroidism, HLD, PVD Accompanied by his family  Hb 13.3 12/28 with heme neg stools. Neg EGD except for erosive gastritis 09/2019, neg HP.  Treated with Nexium 40 mg p.o. once a day.  No nausea, vomiting, heartburn (as long as he takes Nexium), regurgitation, odynophagia or dysphagia.  No significant diarrhea or constipation (better recently). No melena or hematochezia. No unintentional weight loss. No abdominal pain.  No nonsteroidals.    Previous GI procedures: -Colonoscopy 10/2014: 6 mm colonic polyp s/p polypectomy (serrated adenoma), mild sigmoid diverticulosis.  Highly redundant colon.  Recommended to repeat only if any new problems. -CT AP with contrast 12/2015: neg for any acute abnormalities.  Stool-filled colon c/w constipation, extensive atherosclerosis. -EGD 09/2019: -Erosive gastritis. Bx- neg for HP 12/2018: Gastroduodenitis. Bx-neg CLO testing. -Barium enema 04/1998: Highly redundant colon. -[place] 11/2012: neg, HIDA scan ejection fraction negative Past Medical History:  Diagnosis Date  . Anemia   . Diverticulosis   . Gastroduodenitis   . GERD (gastroesophageal reflux disease)   . Hypertension   . Internal hemorrhoids   . Neuropathy   . Thyroid disease      Past Surgical History:  Procedure Laterality Date  . COLONOSCOPY  10/10/2014   Colonic post status polypectomy. Mild predominantly sigmoid diverticulosis. Otherwise normal colonoscopt to TI.  Marland Kitchen ESOPHAGOGASTRODUODENOSCOPY  10/20/2008   Irregular Zline suggestive of GERD. Otherwise normal EGD.  Marland Kitchen SHOULDER SURGERY  06/25/2019  . UPPER GASTROINTESTINAL ENDOSCOPY     10 yrs ago    Family History  Problem Relation Age of Onset  . Colon cancer Neg Hx   . Colon polyps Neg Hx   . Esophageal cancer Neg Hx   . Rectal cancer Neg Hx   . Stomach cancer Neg Hx     Social History   Tobacco Use  . Smoking status: Former Research scientist (life sciences)  . Smokeless tobacco: Never Used  Vaping Use  . Vaping Use: Never used  Substance Use Topics  . Alcohol use: Not Currently  . Drug use: Never    Current Outpatient Medications  Medication Sig Dispense Refill  . atenolol (TENORMIN) 25 MG tablet Take by mouth daily.    Marland Kitchen esomeprazole (NEXIUM) 40 MG packet Take 40 mg by mouth daily before breakfast. 30 each 11  . levothyroxine (SYNTHROID) 75 MCG tablet Take 75 mcg by mouth daily before breakfast.     No current facility-administered medications for this visit.    Allergies  Allergen Reactions  . Atorvastatin Other (See Comments)    Chest pain Chest pain   . Bactrim [Sulfamethoxazole-Trimethoprim]   . Prednisone   . Sulfa Antibiotics     Review  of Systems:  neg     Physical Exam:    BP (!) 150/82   Pulse 65   Ht 5' 10.5" (1.791 m)   Wt 181 lb 2 oz (82.2 kg)   BMI 25.62 kg/m  Wt Readings from Last 3 Encounters:  08/28/20 181 lb 2 oz (82.2 kg)  09/17/19 181 lb (82.1 kg)  09/01/19 181 lb (82.1 kg)   Constitutional: Thin built, in no acute distress. Psychiatric: Normal mood and affect. Behavior is normal. HEENT: Pupils normal.  Conjunctivae are normal. No scleral icterus. Neck supple.  Cardiovascular: Normal rate, regular rhythm. No edema Pulmonary/chest: Effort normal and breath sounds  normal. No wheezing, rales or rhonchi. Abdominal: Soft, nondistended.  Mild epigastric tenderness bowel sounds active throughout. There are no masses palpable. No hepatomegaly. Rectal:  defered Neurological: Alert and oriented to person place and time. Skin: Skin is warm and dry. No rashes noted.     Carmell Austria, MD 08/28/2020, 1:40 PM  Cc: Dr. Bea Graff

## 2020-08-29 LAB — COMPREHENSIVE METABOLIC PANEL
ALT: 9 U/L (ref 0–53)
AST: 15 U/L (ref 0–37)
Albumin: 4.1 g/dL (ref 3.5–5.2)
Alkaline Phosphatase: 58 U/L (ref 39–117)
BUN: 22 mg/dL (ref 6–23)
CO2: 30 mEq/L (ref 19–32)
Calcium: 9.8 mg/dL (ref 8.4–10.5)
Chloride: 104 mEq/L (ref 96–112)
Creatinine, Ser: 1.21 mg/dL (ref 0.40–1.50)
GFR: 55.82 mL/min — ABNORMAL LOW (ref >60.00)
Glucose, Bld: 70 mg/dL (ref 70–99)
Potassium: 4.5 mEq/L (ref 3.5–5.1)
Sodium: 140 mEq/L (ref 135–145)
Total Bilirubin: 0.7 mg/dL (ref 0.2–1.2)
Total Protein: 6.8 g/dL (ref 6.0–8.3)

## 2020-08-29 LAB — CBC WITH DIFFERENTIAL/PLATELET
Basophils Absolute: 0.1 10*3/uL (ref 0.0–0.1)
Basophils Relative: 1.1 % (ref 0.0–3.0)
Eosinophils Absolute: 0.1 10*3/uL (ref 0.0–0.7)
Eosinophils Relative: 1.5 % (ref 0.0–5.0)
HCT: 40.1 % (ref 39.0–52.0)
Hemoglobin: 13.6 g/dL (ref 13.0–17.0)
Lymphocytes Relative: 26.8 % (ref 12.0–46.0)
Lymphs Abs: 1.8 10*3/uL (ref 0.7–4.0)
MCHC: 33.9 g/dL (ref 30.0–36.0)
MCV: 92.7 fl (ref 78.0–100.0)
Monocytes Absolute: 0.7 10*3/uL (ref 0.1–1.0)
Monocytes Relative: 11.1 % (ref 3.0–12.0)
Neutro Abs: 3.9 10*3/uL (ref 1.4–7.7)
Neutrophils Relative %: 59.5 % (ref 43.0–77.0)
Platelets: 198 10*3/uL (ref 150.0–400.0)
RBC: 4.33 Mil/uL (ref 4.22–5.81)
RDW: 14.4 % (ref 11.5–15.5)
WBC: 6.6 10*3/uL (ref 4.0–10.5)

## 2020-08-29 LAB — CELIAC PANEL 10: Tissue Transglut Ab: <2 U/mL (ref 0–5)

## 2020-08-30 LAB — IRON AND TIBC
Iron Saturation: 33 % (ref 15–55)
Iron: 85 ug/dL (ref 38–169)
Total Iron Binding Capacity: 259 ug/dL (ref 250–450)
UIBC: 174 ug/dL (ref 111–343)

## 2020-08-30 LAB — CELIAC PANEL 10
Antigliadin Abs, IgA: 6 units (ref 0–19)
Endomysial IgA: NEGATIVE
Gliadin IgG: 7 units (ref 0–19)
IgA/Immunoglobulin A, Serum: 147 mg/dL (ref 61–437)
Transglutaminase IgA: <2 U/mL (ref 0–3)

## 2020-10-02 DIAGNOSIS — H35371 Puckering of macula, right eye: Secondary | ICD-10-CM | POA: Diagnosis not present

## 2020-10-30 DIAGNOSIS — H35371 Puckering of macula, right eye: Secondary | ICD-10-CM | POA: Diagnosis not present

## 2020-11-06 ENCOUNTER — Encounter: Payer: Self-pay | Admitting: Cardiology

## 2020-11-06 ENCOUNTER — Encounter: Payer: Self-pay | Admitting: *Deleted

## 2020-11-06 DIAGNOSIS — E7800 Pure hypercholesterolemia, unspecified: Secondary | ICD-10-CM | POA: Insufficient documentation

## 2020-11-10 DIAGNOSIS — D649 Anemia, unspecified: Secondary | ICD-10-CM | POA: Insufficient documentation

## 2020-11-10 DIAGNOSIS — K5790 Diverticulosis of intestine, part unspecified, without perforation or abscess without bleeding: Secondary | ICD-10-CM | POA: Insufficient documentation

## 2020-11-10 DIAGNOSIS — E538 Deficiency of other specified B group vitamins: Secondary | ICD-10-CM | POA: Insufficient documentation

## 2020-11-10 DIAGNOSIS — K2990 Gastroduodenitis, unspecified, without bleeding: Secondary | ICD-10-CM | POA: Insufficient documentation

## 2020-11-10 DIAGNOSIS — K648 Other hemorrhoids: Secondary | ICD-10-CM | POA: Insufficient documentation

## 2020-11-10 DIAGNOSIS — E079 Disorder of thyroid, unspecified: Secondary | ICD-10-CM | POA: Insufficient documentation

## 2020-11-23 NOTE — Progress Notes (Signed)
Cardiology Office Note:    Date:  11/24/2020   ID:  Steven Valenzuela, DOB Aug 03, 1936, MRN [id number]  PCP:  Raina Mina., MD  Cardiologist:  Shirlee More, MD   Referring MD: Raina Mina., MD  ASSESSMENT:    1. Exertional chest pain   2. Coronary artery calcification seen on CT scan   3. Hyperlipidemia, unspecified hyperlipidemia type   4. Benign hypertension with CKD (chronic kidney disease) stage III ([place])   5. Anemia, unspecified type    PLAN:    In order of problems listed above:  1. He is having exertional chest pain although at times it happens at rest quite suggestive of angina.  We discussed techniques for further evaluation because of his CKD had like to avoid contrast unless its coronary angiography he will be set up for an exercise Myoview in the office and if abnormal decision about medical therapy versus coronary angiography.  If abnormal I will approach about 5 PCSK9 therapy for his lipid disorder 2. Poorly controlled lipids statin intolerant and he declines other therapy at this time 3. Stable hypertension and CKD 4. Stable anemia  Next appointment 4 weeks   Medication Adjustments/Labs and Tests Ordered: Current medicines are reviewed at length with the patient today.  Concerns regarding medicines are outlined above.  Orders Placed This Encounter  Procedures  . MYOCARDIAL PERFUSION IMAGING  . EKG 12-Lead   Meds ordered this encounter  Medications  . nitroGLYCERIN (NITROSTAT) 0.4 MG SL tablet    Sig: Place 1 tablet (0.4 mg total) under the tongue every 5 (five) minutes as needed for chest pain.    Dispense:  25 tablet    Refill:  3     Chief Complaint  Patient presents with  . Chest Pain    History of Present Illness:    Steven Valenzuela is a 85 y.o. male who is being seen today to reestablish cardiology care at the request of Raina Mina., MD. Anemia previous ulcer disease stage III chronic kidney disease hypothyroidism hypertension. ABIs  [facility] 07/05/2020 lower extremity were normal He was seen by one of my previous partners at [facility] cardiology 08/30/2016 for coronary artery calcification on CT scan with a normal myocardial perfusion test.  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  With activity relieved with rest substernal aching radiates through to his back and shortness of breath. The episodes occur more frequently and are more severe than they were a year ago. He is also had less severe episodes after a large meal in the evening lasting up to 15 minutes. No edema orthopnea palpitation or syncope. Past Medical History:  Diagnosis Date  . Anemia   . Anemia of unknown etiology 06/26/2020   Formatting of this note might be different from the original. Likely of CKD  . Atherosclerosis of both carotid arteries 09/27/2016   Formatting of this note might be different from the original. Noted. Mild Not obstructive.  . Balance problem 10/11/2015  . Benign prostatic hyperplasia without urinary obstruction 01/15/2016  . Cardiac arrhythmia 01/15/2016  .  Carpal tunnel syndrome 01/15/2016  . Deficiency of other specified B group vitamins 01/15/2016  . Degenerative cervical disc 09/25/2016  . Diverticulosis   . ED (erectile dysfunction) of organic origin 01/15/2016  . Essential (primary) hypertension 01/15/2016  . Gastroduodenitis   . GERD (gastroesophageal reflux disease)   . History of bleeding peptic ulcer 08/01/2020   Formatting of this note might be different from the original. [place]  around 2020.  Marland Kitchen Hypercholesteremia   . Hypertrophy of breast 01/15/2016  . Hypogonadism in male 01/15/2016  . Hypothyroidism, unspecified 01/15/2016  . Internal hemorrhoids   . Lumbar radiculopathy, chronic 02/21/2016  . Neuropathy   . Numbness in feet 10/11/2015  . Pain in both feet 10/11/2015  . Pure hypercholesterolemia, unspecified 01/15/2016  . Stage 3b chronic kidney disease ([place]) 06/26/2020  . Thyroid disease   . Unilateral inguinal hernia, without obstruction or gangrene, not specified as recurrent 01/15/2016  . Vitamin B12 deficiency     Past Surgical History:  Procedure Laterality Date  . COLONOSCOPY  10/10/2014   Colonic post status polypectomy. Mild predominantly sigmoid diverticulosis. Otherwise normal colonoscopt to TI.  Marland Kitchen ESOPHAGOGASTRODUODENOSCOPY  10/20/2008   Irregular Zline suggestive of GERD. Otherwise normal EGD.  Marland Kitchen SHOULDER SURGERY Right 06/25/2019  . SKIN SURGERY     Skin cancer removed from Right side of face  . UPPER GASTROINTESTINAL ENDOSCOPY     10 yrs ago    Current Medications: Current Meds  Medication Sig  . atenolol (TENORMIN) 50 MG tablet Take 50 mg by mouth daily.  . cyanocobalamin 1000 MCG tablet Take 1,000 mcg by mouth daily.  Marland Kitchen esomeprazole (NEXIUM) 40 MG packet Take 40 mg by mouth daily before breakfast.  . levothyroxine (SYNTHROID) 75 MCG tablet Take 75 mcg by mouth daily before breakfast.  . nitroGLYCERIN (NITROSTAT) 0.4 MG SL tablet Place 1 tablet (0.4 mg total) under the tongue every 5 (five) minutes as needed for chest pain.     Allergies:   Atorvastatin, Bactrim [sulfamethoxazole-trimethoprim], Prednisone, and Sulfa antibiotics   Social History   Socioeconomic History  . Marital status: Married    Spouse name: Not on file  . Number of children: Not on file  . Years of education: Not on file  . Highest education level: Not on file  Occupational History  . Not on file  Tobacco Use  . Smoking status: Former Research scientist (life sciences)  . Smokeless  tobacco: Never Used  Vaping Use  . Vaping Use: Never used  Substance and Sexual Activity  . Alcohol use: Not Currently  . Drug use: Never  . Sexual activity: Not on file  Other Topics Concern  . Not on file  Social History Narrative  . Not on file   Social Determinants of Health   Financial Resource Strain: Not on file  Food Insecurity: Not on file  Transportation Needs: Not on file  Physical Activity: Not on file  Stress: Not on file  Social Connections: Not on file     Family History: The patient's family history is negative for Colon cancer, Colon polyps, Esophageal cancer, Rectal cancer, and Stomach cancer.  ROS:   ROS Please see the history of present illness.     All other systems reviewed and are negative.  EKGs/Labs/Other Studies Reviewed:    The following studies were reviewed today:   EKG:  EKG is  ordered today.  The ekg ordered today is personally reviewed and demonstrates sinus rhythm and is normal  Recent Labs: 08/28/2020: ALT 9; BUN  22; Creatinine, Ser 1.21; Hemoglobin 13.6; Platelets 198.0; Potassium 4.5; Sodium 140   Physical Exam:    VS:  BP (!) 156/76   Pulse 62   Ht 5' 10.6" (1.793 m)   Wt 179 lb 3.2 oz (81.3 kg)   SpO2 99%   BMI 25.28 kg/m     Wt Readings from Last 3 Encounters:  11/24/20 179 lb 3.2 oz (81.3 kg)  08/01/20 181 lb (82.1 kg)  08/28/20 181 lb 2 oz (82.2 kg)     GEN:  Well nourished, well developed in no acute distress HEENT: Normal NECK: No JVD; No carotid bruits LYMPHATICS: No lymphadenopathy CARDIAC: RRR, no murmurs, rubs, gallops RESPIRATORY:  Clear to auscultation without rales, wheezing or rhonchi  ABDOMEN: Soft, non-tender, non-distended MUSCULOSKELETAL:  No edema; No deformity  SKIN: Warm and dry NEUROLOGIC:  Alert and oriented x 3 PSYCHIATRIC:  Normal affect     Signed, Shirlee More, MD  11/24/2020 2:38 PM    [place]

## 2020-11-24 ENCOUNTER — Encounter: Payer: Self-pay | Admitting: Cardiology

## 2020-11-24 ENCOUNTER — Ambulatory Visit: Payer: PPO | Admitting: Cardiology

## 2020-11-24 ENCOUNTER — Other Ambulatory Visit: Payer: Self-pay

## 2020-11-24 VITALS — BP 156/76 | HR 62 | Ht 70.6 in | Wt 179.2 lb

## 2020-11-24 DIAGNOSIS — I2510 Atherosclerotic heart disease of native coronary artery without angina pectoris: Secondary | ICD-10-CM | POA: Diagnosis not present

## 2020-11-24 DIAGNOSIS — E785 Hyperlipidemia, unspecified: Secondary | ICD-10-CM

## 2020-11-24 DIAGNOSIS — I129 Hypertensive chronic kidney disease with stage 1 through stage 4 chronic kidney disease, or unspecified chronic kidney disease: Secondary | ICD-10-CM | POA: Diagnosis not present

## 2020-11-24 DIAGNOSIS — D649 Anemia, unspecified: Secondary | ICD-10-CM

## 2020-11-24 DIAGNOSIS — R079 Chest pain, unspecified: Secondary | ICD-10-CM | POA: Diagnosis not present

## 2020-11-24 DIAGNOSIS — N183 Chronic kidney disease, stage 3 unspecified: Secondary | ICD-10-CM

## 2020-11-24 MED ORDER — NITROGLYCERIN 0.4 MG SL SUBL: 0.4000 mg | SUBLINGUAL_TABLET | SUBLINGUAL | 3 refills | Status: AC | PRN

## 2020-11-24 NOTE — Patient Instructions (Signed)
Medication Instructions:  Your physician has recommended you make the following change in your medication:  START: Nitroglycerin 0.4 mg take one tablet by mouth every 5 minutes up to three times as needed for chest pain.  *If you need a refill on your cardiac medications before your next appointment, please call your pharmacy*   Lab Work: None If you have labs (blood work) drawn today and your tests are completely normal, you will receive your results only by: Marland Kitchen MyChart Message (if you have MyChart) OR . A paper copy in the mail If you have any lab test that is abnormal or we need to change your treatment, we will call you to review the results.   Testing/Procedures:   [facility] at [facility] [address] [place], [place] [address] Phone: [phone]    Please arrive 15 minutes prior to your appointment time for registration and insurance purposes.  The test will take approximately 3 to 4 hours to complete; you may bring reading material.  If someone comes with you to your appointment, they will need to remain in the main lobby due to limited space in the testing area. **If you are pregnant or breastfeeding, please notify the nuclear lab prior to your appointment**  How to prepare for your Myocardial Perfusion Test: . Do not eat or drink 3 hours prior to your test, except you may have water. . Do not consume products containing caffeine (regular or decaffeinated) 12 hours prior to your test. (ex: coffee, chocolate, sodas, tea). . Do bring a list of your current medications with you.  If not listed below, you may take your medications as normal. . Do wear comfortable clothes (no dresses or overalls) and walking shoes, tennis shoes preferred (No heels or open toe shoes are allowed). . Do NOT wear cologne, perfume, aftershave, or lotions (deodorant is allowed). . If these instructions are not followed, your test will have to be  rescheduled.  Please report to [address] for your test.  If you have questions or concerns about your appointment, you can call the Nuclear Lab at [phone].  If you cannot keep your appointment, please provide 24 hours notification to the Nuclear Lab, to avoid a possible $50 charge to your account.    Follow-Up: At [facility], you and your health needs are our priority.  As part of our continuing mission to provide you with exceptional heart care, we have created designated Provider Care Teams.  These Care Teams include your primary Cardiologist (physician) and Advanced Practice Providers (APPs -  Physician Assistants and Nurse Practitioners) who all work together to provide you with the care you need, when you need it.  We recommend signing up for the patient portal called "MyChart".  Sign up information is provided on this After Visit Summary.  MyChart is used to connect with patients for Virtual Visits (Telemedicine).  Patients are able to view lab/test results, encounter notes, upcoming appointments, etc.  Non-urgent messages can be sent to your provider as well.   To learn more about what you can do with MyChart, go to [email or web address].    Your next appointment:   4 week(s)  The format for your next appointment:   In Person  Provider:   Shirlee More, MD   Other Instructions

## 2020-11-28 ENCOUNTER — Telehealth (HOSPITAL_COMMUNITY): Payer: Self-pay | Admitting: *Deleted

## 2020-11-28 NOTE — Telephone Encounter (Signed)
Left message on voicemail per DPR in reference to upcoming appointment scheduled on 12/04/20 at 10:45 with detailed instructions given per Myocardial Perfusion Study Information Sheet for the test. LM to arrive 15 minutes early, and that it is imperative to arrive on time for appointment to keep from having the test rescheduled. If you need to cancel or reschedule your appointment, please call the office within 24 hours of your appointment. Failure to do so may result in a cancellation of your appointment, and a $50 no show fee. Phone number given for call back for any questions.

## 2020-12-04 ENCOUNTER — Encounter (INDEPENDENT_AMBULATORY_CARE_PROVIDER_SITE_OTHER): Payer: Self-pay

## 2020-12-04 ENCOUNTER — Ambulatory Visit (HOSPITAL_COMMUNITY): Payer: PPO | Attending: Cardiology

## 2020-12-04 ENCOUNTER — Other Ambulatory Visit: Payer: Self-pay

## 2020-12-04 DIAGNOSIS — I2510 Atherosclerotic heart disease of native coronary artery without angina pectoris: Secondary | ICD-10-CM | POA: Insufficient documentation

## 2020-12-04 DIAGNOSIS — D649 Anemia, unspecified: Secondary | ICD-10-CM | POA: Insufficient documentation

## 2020-12-04 DIAGNOSIS — N183 Chronic kidney disease, stage 3 unspecified: Secondary | ICD-10-CM | POA: Diagnosis not present

## 2020-12-04 DIAGNOSIS — I129 Hypertensive chronic kidney disease with stage 1 through stage 4 chronic kidney disease, or unspecified chronic kidney disease: Secondary | ICD-10-CM | POA: Insufficient documentation

## 2020-12-04 DIAGNOSIS — E785 Hyperlipidemia, unspecified: Secondary | ICD-10-CM | POA: Diagnosis not present

## 2020-12-04 LAB — MYOCARDIAL PERFUSION IMAGING
Estimated workload: 6.7 METS
Exercise duration (min): 4 min
Exercise duration (sec): 50 s
LV dias vol: 63 mL (ref 62–150)
LV sys vol: 19 mL
MPHR: 138 {beats}/min
Peak HR: 121 {beats}/min
Percent HR: 87 %
Rest HR: 67 {beats}/min
SDS: 2
SRS: 0
SSS: 2
TID: 0.93

## 2020-12-04 MED ORDER — TECHNETIUM TC 99M TETROFOSMIN IV KIT
10.7000 | PACK | Freq: Once | INTRAVENOUS | Status: AC | PRN
  Administered 2020-12-04: 10.7 via INTRAVENOUS
  Filled 2020-12-04: qty 11

## 2020-12-04 MED ORDER — TECHNETIUM TC 99M TETROFOSMIN IV KIT
31.2000 | PACK | Freq: Once | INTRAVENOUS | Status: AC | PRN
Start: 2020-12-04 — End: 2020-12-04
  Administered 2020-12-04: 31.2 via INTRAVENOUS
  Filled 2020-12-04: qty 32

## 2020-12-06 ENCOUNTER — Telehealth: Payer: Self-pay

## 2020-12-06 NOTE — Telephone Encounter (Signed)
-----   Message from Berniece Salines, DO sent at 12/05/2020  1:05 PM EDT ----- This is Dr. Joya Gaskins patient, great news, stress test normal

## 2020-12-06 NOTE — Telephone Encounter (Signed)
Patient notified of test results 

## 2020-12-08 ENCOUNTER — Telehealth: Payer: Self-pay

## 2020-12-08 NOTE — Telephone Encounter (Signed)
-----   Message from Richardo Priest, MD sent at 12/08/2020  1:33 PM EDT ----- Good result No findings of concerning CAD We can discuss at office FU

## 2020-12-08 NOTE — Telephone Encounter (Signed)
Spoke with patient regarding results and recommendation.  Patient verbalizes understanding and is agreeable to plan of care. Advised patient to call back with any issues or concerns.  

## 2020-12-25 DIAGNOSIS — N1831 Chronic kidney disease, stage 3a: Secondary | ICD-10-CM | POA: Diagnosis not present

## 2020-12-25 DIAGNOSIS — D649 Anemia, unspecified: Secondary | ICD-10-CM | POA: Diagnosis not present

## 2020-12-25 DIAGNOSIS — K219 Gastro-esophageal reflux disease without esophagitis: Secondary | ICD-10-CM | POA: Diagnosis not present

## 2020-12-25 DIAGNOSIS — T466X5A Adverse effect of antihyperlipidemic and antiarteriosclerotic drugs, initial encounter: Secondary | ICD-10-CM | POA: Diagnosis not present

## 2020-12-25 DIAGNOSIS — I129 Hypertensive chronic kidney disease with stage 1 through stage 4 chronic kidney disease, or unspecified chronic kidney disease: Secondary | ICD-10-CM | POA: Diagnosis not present

## 2020-12-25 DIAGNOSIS — R079 Chest pain, unspecified: Secondary | ICD-10-CM | POA: Diagnosis not present

## 2020-12-25 DIAGNOSIS — E538 Deficiency of other specified B group vitamins: Secondary | ICD-10-CM | POA: Diagnosis not present

## 2020-12-25 DIAGNOSIS — E039 Hypothyroidism, unspecified: Secondary | ICD-10-CM | POA: Diagnosis not present

## 2020-12-25 DIAGNOSIS — I2510 Atherosclerotic heart disease of native coronary artery without angina pectoris: Secondary | ICD-10-CM | POA: Diagnosis not present

## 2020-12-25 DIAGNOSIS — I10 Essential (primary) hypertension: Secondary | ICD-10-CM | POA: Diagnosis not present

## 2020-12-25 DIAGNOSIS — I2584 Coronary atherosclerosis due to calcified coronary lesion: Secondary | ICD-10-CM | POA: Diagnosis not present

## 2020-12-25 DIAGNOSIS — M791 Myalgia, unspecified site: Secondary | ICD-10-CM | POA: Diagnosis not present

## 2021-01-02 NOTE — Progress Notes (Signed)
Cardiology Office Note:    Date:  01/03/2021   ID:  Steven Valenzuela, DOB November 18, 1935, MRN [id number]  PCP:  Raina Mina., MD  Cardiologist:  Shirlee More, MD    Referring MD: Raina Mina., MD    ASSESSMENT:    1. Angina pectoris ([place])   2. Benign hypertension with CKD (chronic kidney disease) stage III ([place])   3. Hyperlipidemia, unspecified hyperlipidemia type   4. Palpitations    PLAN:    In order of problems listed above:  1. Stable, he has a low risk myocardial perfusion study at this time I would refer to coronary angiography.  Were not can place him on lipid-lowering therapy his decision and not start antiplatelet with his history of GI bleed intolerance of aspirin.  If at all possible I would avoid PCI. 2. Stable continue current treatment 3. Poorly controlled he declines lipid-lowering treatment 4. Check 3-day ZIO, continue beta-blockers heart rate at rest 58 bpm   Next appointment: 6 months   Medication Adjustments/Labs and Tests Ordered: Current medicines are reviewed at length with the patient today.  Concerns regarding medicines are outlined above.  Orders Placed This Encounter  Procedures  . LONG TERM MONITOR (3-14 DAYS)   Meds ordered this encounter  Medications  . isosorbide mononitrate (IMDUR) 30 MG 24 hr tablet    Sig: Take 1 tablet (30 mg total) by mouth daily.    Dispense:  90 tablet    Refill:  3    No chief complaint on file.   History of Present Illness:    Steven Valenzuela is a 85 y.o. male with a hx of coronary artery calcification hyperlipidemia hypertension with stage III CKD anemia and exertional shortness of breath quite suggestive of angina equivalent  last seen 11/25/1998 2010.  Compliance with diet, lifestyle and medications: Yes  I reviewed his perfusion study with him no evidence of ischemia. He has had no further exertional angina He has many complaints headache back pain and also palpitation when he gets out of bed to go to  the bathroom at nighttime. Will optimize medical treatment by adding oral nitrates.  He tells me he cannot take aspirin because of GI bleeding.  He declines to consider lipid-lowering therapy he has had an adverse reaction to medication in the past  With the CKD underwent a myocardial perfusion test for ischemia evaluation 12/19/2020 ejection fraction 70% normal EKG ST segment response and blood pressure response with Lexiscan normal ventricular perfusion and function. Past Medical History:  Diagnosis Date  . Anemia   . Anemia of unknown etiology 06/26/2020   Formatting of this note might be different from the original. Likely of CKD  . Atherosclerosis of both carotid arteries 09/27/2016   Formatting of this note might be different from the original. Noted. Mild Not obstructive.  . Balance problem 10/11/2015  . Benign prostatic hyperplasia without urinary obstruction 01/15/2016  . Cardiac arrhythmia 01/15/2016  . Carpal tunnel syndrome 01/15/2016  . Deficiency of other specified B group vitamins 01/15/2016  . Degenerative cervical disc 09/25/2016  . Diverticulosis   . ED (erectile dysfunction) of organic origin 01/15/2016  . Essential (primary) hypertension 01/15/2016  . Gastroduodenitis   . GERD (gastroesophageal reflux disease)   . History of bleeding peptic ulcer 08/01/2020   Formatting of this note might be different from the original. [place] around 2020.  Marland Kitchen Hypercholesteremia   . Hypertrophy of breast 01/15/2016  . Hypogonadism in male 01/15/2016  . Hypothyroidism, unspecified 01/15/2016  .  Internal hemorrhoids   . Lumbar radiculopathy, chronic 02/21/2016  . Neuropathy   . Numbness in feet 10/11/2015  . Pain in both feet 10/11/2015  . Pure hypercholesterolemia, unspecified 01/15/2016  . Stage 3b chronic kidney disease ([place]) 06/26/2020  . Thyroid disease   . Unilateral inguinal hernia, without obstruction or gangrene, not specified as recurrent 01/15/2016  . Vitamin B12 deficiency     Past  Surgical History:  Procedure Laterality Date  . COLONOSCOPY  10/10/2014   Colonic post status polypectomy. Mild predominantly sigmoid diverticulosis. Otherwise normal colonoscopt to TI.  Marland Kitchen ESOPHAGOGASTRODUODENOSCOPY  10/20/2008   Irregular Zline suggestive of GERD. Otherwise normal EGD.  Marland Kitchen SHOULDER SURGERY Right 06/25/2019  . SKIN SURGERY     Skin cancer removed from Right side of face  . UPPER GASTROINTESTINAL ENDOSCOPY     10 yrs ago    Current Medications: Current Meds  Medication Sig  . atenolol (TENORMIN) 50 MG tablet Take 50 mg by mouth daily.  . cyanocobalamin 1000 MCG tablet Take 1,000 mcg by mouth daily.  Marland Kitchen esomeprazole (NEXIUM) 40 MG packet Take 40 mg by mouth daily before breakfast.  . isosorbide mononitrate (IMDUR) 30 MG 24 hr tablet Take 1 tablet (30 mg total) by mouth daily.  Marland Kitchen levothyroxine (SYNTHROID) 75 MCG tablet Take 75 mcg by mouth daily before breakfast.  . nitroGLYCERIN (NITROSTAT) 0.4 MG SL tablet Place 1 tablet (0.4 mg total) under the tongue every 5 (five) minutes as needed for chest pain.     Allergies:   Atorvastatin, Bactrim [sulfamethoxazole-trimethoprim], Prednisone, and Sulfa antibiotics   Social History   Socioeconomic History  . Marital status: Married    Spouse name: Not on file  . Number of children: Not on file  . Years of education: Not on file  . Highest education level: Not on file  Occupational History  . Not on file  Tobacco Use  . Smoking status: Former Research scientist (life sciences)  . Smokeless tobacco: Never Used  Vaping Use  . Vaping Use: Never used  Substance and Sexual Activity  . Alcohol use: Not Currently  . Drug use: Never  . Sexual activity: Not on file  Other Topics Concern  . Not on file  Social History Narrative  . Not on file   Social Determinants of Health   Financial Resource Strain: Not on file  Food Insecurity: Not on file  Transportation Needs: Not on file  Physical Activity: Not on file  Stress: Not on file  Social  Connections: Not on file     Family History: The patient's family history is negative for Colon cancer, Colon polyps, Esophageal cancer, Rectal cancer, and Stomach cancer. ROS:   Please see the history of present illness.    All other systems reviewed and are negative.  EKGs/Labs/Other Studies Reviewed:    The following studies were reviewed today:    Recent Labs: 08/28/2020: ALT 9; BUN 22; Creatinine, Ser 1.21; Hemoglobin 13.6; Platelets 198.0; Potassium 4.5; Sodium 140  Recent Lipid Panel Cholesterol 252 LDL 191 triglycerides 161 HDL 38 06/25/2020  Physical Exam:    VS:  BP (!) 149/67 (BP Location: Left Arm, Patient Position: Sitting, Cuff Size: Normal)   Pulse (!) 58   Ht 5\' 10"  (1.778 m)   Wt 180 lb (81.6 kg)   SpO2 98%   BMI 25.83 kg/m     Wt Readings from Last 3 Encounters:  01/03/21 180 lb (81.6 kg)  12/04/20 179 lb (81.2 kg)  11/24/20 179 lb 3.2 oz (  81.3 kg)     GEN:  Well nourished, well developed in no acute distress HEENT: Normal NECK: No JVD; No carotid bruits LYMPHATICS: No lymphadenopathy CARDIAC: RRR, no murmurs, rubs, gallops RESPIRATORY:  Clear to auscultation without rales, wheezing or rhonchi  ABDOMEN: Soft, non-tender, non-distended MUSCULOSKELETAL:  No edema; No deformity  SKIN: Warm and dry NEUROLOGIC:  Alert and oriented x 3 PSYCHIATRIC:  Normal affect    Signed, Shirlee More, MD  01/03/2021 8:32 AM    [facility]

## 2021-01-03 ENCOUNTER — Encounter: Payer: Self-pay | Admitting: Cardiology

## 2021-01-03 ENCOUNTER — Ambulatory Visit (INDEPENDENT_AMBULATORY_CARE_PROVIDER_SITE_OTHER): Payer: PPO

## 2021-01-03 ENCOUNTER — Ambulatory Visit: Payer: PPO | Admitting: Cardiology

## 2021-01-03 ENCOUNTER — Other Ambulatory Visit: Payer: Self-pay

## 2021-01-03 VITALS — BP 149/67 | HR 58 | Ht 70.0 in | Wt 180.0 lb

## 2021-01-03 DIAGNOSIS — I209 Angina pectoris, unspecified: Secondary | ICD-10-CM

## 2021-01-03 DIAGNOSIS — I129 Hypertensive chronic kidney disease with stage 1 through stage 4 chronic kidney disease, or unspecified chronic kidney disease: Secondary | ICD-10-CM

## 2021-01-03 DIAGNOSIS — N183 Chronic kidney disease, stage 3 unspecified: Secondary | ICD-10-CM | POA: Diagnosis not present

## 2021-01-03 DIAGNOSIS — E785 Hyperlipidemia, unspecified: Secondary | ICD-10-CM | POA: Diagnosis not present

## 2021-01-03 DIAGNOSIS — R002 Palpitations: Secondary | ICD-10-CM

## 2021-01-03 MED ORDER — ISOSORBIDE MONONITRATE ER 30 MG PO TB24: 30.0000 mg | ORAL_TABLET | Freq: Every day | ORAL | 3 refills | Status: AC

## 2021-01-03 NOTE — Patient Instructions (Signed)
Medication Instructions:  Your physician has recommended you make the following change in your medication:  START: Imdur 30 mg take one tablet by mouth daily.  *If you need a refill on your cardiac medications before your next appointment, please call your pharmacy*   Lab Work: None If you have labs (blood work) drawn today and your tests are completely normal, you will receive your results only by: Marland Kitchen MyChart Message (if you have MyChart) OR . A paper copy in the mail If you have any lab test that is abnormal or we need to change your treatment, we will call you to review the results.   Testing/Procedures: A zio monitor was ordered today. It will remain on for 3 days. You will then return monitor and event diary in provided box. It takes 1-2 weeks for report to be downloaded and returned to [place]. We will call you with the results. If monitor falls off or has orange flashing light, please call Zio for further instructions.      Follow-Up: At [facility], you and your health needs are our priority.  As part of our continuing mission to provide you with exceptional heart care, we have created designated Provider Care Teams.  These Care Teams include your primary Cardiologist (physician) and Advanced Practice Providers (APPs -  Physician Assistants and Nurse Practitioners) who all work together to provide you with the care you need, when you need it.  We recommend signing up for the patient portal called "MyChart".  Sign up information is provided on this After Visit Summary.  MyChart is used to connect with patients for Virtual Visits (Telemedicine).  Patients are able to view lab/test results, encounter notes, upcoming appointments, etc.  Non-urgent messages can be sent to your provider as well.   To learn more about what you can do with MyChart, go to [email or web address].    Your next appointment:   6 month(s)  The format for your next appointment:   In Person  Provider:   Shirlee More, MD   Other Instructions

## 2021-01-10 DIAGNOSIS — R002 Palpitations: Secondary | ICD-10-CM | POA: Diagnosis not present

## 2021-01-12 ENCOUNTER — Telehealth: Payer: Self-pay

## 2021-01-12 NOTE — Telephone Encounter (Signed)
Spoke with patient regarding results and recommendation.  Patient verbalizes understanding and is agreeable to plan of care. Advised patient to call back with any issues or concerns.  

## 2021-01-12 NOTE — Telephone Encounter (Signed)
-----   Message from Richardo Priest, MD sent at 01/12/2021 12:23 PM EDT ----- His monitor shows frequent atrial premature beats which generally are a nuisance.  I would continue his beta-blocker atenolol  Unless his symptoms are severe reassurance.

## 2021-02-26 DIAGNOSIS — H35371 Puckering of macula, right eye: Secondary | ICD-10-CM | POA: Diagnosis not present

## 2021-03-15 DIAGNOSIS — H35371 Puckering of macula, right eye: Secondary | ICD-10-CM | POA: Diagnosis not present

## 2021-03-15 DIAGNOSIS — H26492 Other secondary cataract, left eye: Secondary | ICD-10-CM | POA: Diagnosis not present

## 2021-03-26 ENCOUNTER — Telehealth: Payer: Self-pay | Admitting: Cardiology

## 2021-03-26 ENCOUNTER — Encounter (HOSPITAL_BASED_OUTPATIENT_CLINIC_OR_DEPARTMENT_OTHER): Payer: Self-pay

## 2021-03-26 ENCOUNTER — Other Ambulatory Visit: Payer: Self-pay

## 2021-03-26 ENCOUNTER — Emergency Department (HOSPITAL_BASED_OUTPATIENT_CLINIC_OR_DEPARTMENT_OTHER): Payer: PPO

## 2021-03-26 ENCOUNTER — Inpatient Hospital Stay (HOSPITAL_BASED_OUTPATIENT_CLINIC_OR_DEPARTMENT_OTHER)
Admission: EM | Admit: 2021-03-26 | Discharge: 2021-04-05 | DRG: 234 | Disposition: E | Payer: PPO | Attending: Thoracic Surgery (Cardiothoracic Vascular Surgery) | Admitting: Thoracic Surgery (Cardiothoracic Vascular Surgery)

## 2021-03-26 DIAGNOSIS — E782 Mixed hyperlipidemia: Secondary | ICD-10-CM | POA: Diagnosis present

## 2021-03-26 DIAGNOSIS — I2584 Coronary atherosclerosis due to calcified coronary lesion: Secondary | ICD-10-CM | POA: Diagnosis not present

## 2021-03-26 DIAGNOSIS — M5030 Other cervical disc degeneration, unspecified cervical region: Secondary | ICD-10-CM | POA: Diagnosis present

## 2021-03-26 DIAGNOSIS — J8410 Pulmonary fibrosis, unspecified: Secondary | ICD-10-CM | POA: Diagnosis not present

## 2021-03-26 DIAGNOSIS — I200 Unstable angina: Secondary | ICD-10-CM | POA: Diagnosis not present

## 2021-03-26 DIAGNOSIS — I97611 Postprocedural hemorrhage and hematoma of a circulatory system organ or structure following cardiac bypass: Secondary | ICD-10-CM | POA: Diagnosis not present

## 2021-03-26 DIAGNOSIS — I319 Disease of pericardium, unspecified: Secondary | ICD-10-CM | POA: Diagnosis not present

## 2021-03-26 DIAGNOSIS — N400 Enlarged prostate without lower urinary tract symptoms: Secondary | ICD-10-CM | POA: Diagnosis present

## 2021-03-26 DIAGNOSIS — R001 Bradycardia, unspecified: Secondary | ICD-10-CM | POA: Diagnosis not present

## 2021-03-26 DIAGNOSIS — I340 Nonrheumatic mitral (valve) insufficiency: Secondary | ICD-10-CM | POA: Diagnosis not present

## 2021-03-26 DIAGNOSIS — Z79899 Other long term (current) drug therapy: Secondary | ICD-10-CM

## 2021-03-26 DIAGNOSIS — Z951 Presence of aortocoronary bypass graft: Secondary | ICD-10-CM

## 2021-03-26 DIAGNOSIS — D6959 Other secondary thrombocytopenia: Secondary | ICD-10-CM | POA: Diagnosis not present

## 2021-03-26 DIAGNOSIS — E538 Deficiency of other specified B group vitamins: Secondary | ICD-10-CM | POA: Diagnosis present

## 2021-03-26 DIAGNOSIS — Z20822 Contact with and (suspected) exposure to covid-19: Secondary | ICD-10-CM | POA: Diagnosis present

## 2021-03-26 DIAGNOSIS — I469 Cardiac arrest, cause unspecified: Secondary | ICD-10-CM

## 2021-03-26 DIAGNOSIS — I491 Atrial premature depolarization: Secondary | ICD-10-CM | POA: Diagnosis not present

## 2021-03-26 DIAGNOSIS — I6523 Occlusion and stenosis of bilateral carotid arteries: Secondary | ICD-10-CM | POA: Diagnosis not present

## 2021-03-26 DIAGNOSIS — J9811 Atelectasis: Secondary | ICD-10-CM | POA: Diagnosis not present

## 2021-03-26 DIAGNOSIS — E785 Hyperlipidemia, unspecified: Secondary | ICD-10-CM | POA: Diagnosis not present

## 2021-03-26 DIAGNOSIS — Z881 Allergy status to other antibiotic agents status: Secondary | ICD-10-CM

## 2021-03-26 DIAGNOSIS — T466X5A Adverse effect of antihyperlipidemic and antiarteriosclerotic drugs, initial encounter: Secondary | ICD-10-CM | POA: Diagnosis not present

## 2021-03-26 DIAGNOSIS — Z888 Allergy status to other drugs, medicaments and biological substances status: Secondary | ICD-10-CM | POA: Diagnosis not present

## 2021-03-26 DIAGNOSIS — N183 Chronic kidney disease, stage 3 unspecified: Secondary | ICD-10-CM | POA: Diagnosis not present

## 2021-03-26 DIAGNOSIS — Y838 Other surgical procedures as the cause of abnormal reaction of the patient, or of later complication, without mention of misadventure at the time of the procedure: Secondary | ICD-10-CM | POA: Diagnosis not present

## 2021-03-26 DIAGNOSIS — I4901 Ventricular fibrillation: Secondary | ICD-10-CM | POA: Diagnosis not present

## 2021-03-26 DIAGNOSIS — D62 Acute posthemorrhagic anemia: Secondary | ICD-10-CM | POA: Diagnosis not present

## 2021-03-26 DIAGNOSIS — E039 Hypothyroidism, unspecified: Secondary | ICD-10-CM | POA: Diagnosis not present

## 2021-03-26 DIAGNOSIS — I973 Postprocedural hypertension: Secondary | ICD-10-CM | POA: Diagnosis not present

## 2021-03-26 DIAGNOSIS — I25110 Atherosclerotic heart disease of native coronary artery with unstable angina pectoris: Secondary | ICD-10-CM | POA: Diagnosis not present

## 2021-03-26 DIAGNOSIS — Z85828 Personal history of other malignant neoplasm of skin: Secondary | ICD-10-CM | POA: Diagnosis not present

## 2021-03-26 DIAGNOSIS — Z882 Allergy status to sulfonamides status: Secondary | ICD-10-CM

## 2021-03-26 DIAGNOSIS — Y713 Surgical instruments, materials and cardiovascular devices (including sutures) associated with adverse incidents: Secondary | ICD-10-CM | POA: Diagnosis not present

## 2021-03-26 DIAGNOSIS — E8770 Fluid overload, unspecified: Secondary | ICD-10-CM | POA: Diagnosis not present

## 2021-03-26 DIAGNOSIS — I25119 Atherosclerotic heart disease of native coronary artery with unspecified angina pectoris: Secondary | ICD-10-CM | POA: Diagnosis not present

## 2021-03-26 DIAGNOSIS — I129 Hypertensive chronic kidney disease with stage 1 through stage 4 chronic kidney disease, or unspecified chronic kidney disease: Secondary | ICD-10-CM | POA: Diagnosis present

## 2021-03-26 DIAGNOSIS — I97120 Postprocedural cardiac arrest following cardiac surgery: Secondary | ICD-10-CM | POA: Diagnosis not present

## 2021-03-26 DIAGNOSIS — Z87891 Personal history of nicotine dependence: Secondary | ICD-10-CM

## 2021-03-26 DIAGNOSIS — I314 Cardiac tamponade: Secondary | ICD-10-CM | POA: Diagnosis not present

## 2021-03-26 DIAGNOSIS — M609 Myositis, unspecified: Secondary | ICD-10-CM | POA: Diagnosis not present

## 2021-03-26 DIAGNOSIS — I459 Conduction disorder, unspecified: Secondary | ICD-10-CM | POA: Diagnosis not present

## 2021-03-26 DIAGNOSIS — K219 Gastro-esophageal reflux disease without esophagitis: Secondary | ICD-10-CM | POA: Diagnosis present

## 2021-03-26 DIAGNOSIS — Z09 Encounter for follow-up examination after completed treatment for conditions other than malignant neoplasm: Secondary | ICD-10-CM

## 2021-03-26 DIAGNOSIS — M79605 Pain in left leg: Secondary | ICD-10-CM | POA: Diagnosis not present

## 2021-03-26 DIAGNOSIS — I10 Essential (primary) hypertension: Secondary | ICD-10-CM | POA: Diagnosis not present

## 2021-03-26 DIAGNOSIS — R911 Solitary pulmonary nodule: Secondary | ICD-10-CM | POA: Diagnosis not present

## 2021-03-26 DIAGNOSIS — R0789 Other chest pain: Secondary | ICD-10-CM | POA: Diagnosis not present

## 2021-03-26 DIAGNOSIS — J90 Pleural effusion, not elsewhere classified: Secondary | ICD-10-CM | POA: Diagnosis not present

## 2021-03-26 DIAGNOSIS — E7800 Pure hypercholesterolemia, unspecified: Secondary | ICD-10-CM | POA: Diagnosis not present

## 2021-03-26 DIAGNOSIS — R079 Chest pain, unspecified: Secondary | ICD-10-CM | POA: Diagnosis present

## 2021-03-26 DIAGNOSIS — N1832 Chronic kidney disease, stage 3b: Secondary | ICD-10-CM | POA: Diagnosis not present

## 2021-03-26 DIAGNOSIS — Z79890 Hormone replacement therapy (postmenopausal): Secondary | ICD-10-CM

## 2021-03-26 DIAGNOSIS — R0602 Shortness of breath: Secondary | ICD-10-CM | POA: Diagnosis not present

## 2021-03-26 DIAGNOSIS — I2510 Atherosclerotic heart disease of native coronary artery without angina pectoris: Secondary | ICD-10-CM | POA: Diagnosis not present

## 2021-03-26 DIAGNOSIS — I081 Rheumatic disorders of both mitral and tricuspid valves: Secondary | ICD-10-CM | POA: Diagnosis not present

## 2021-03-26 DIAGNOSIS — R0600 Dyspnea, unspecified: Secondary | ICD-10-CM | POA: Diagnosis not present

## 2021-03-26 DIAGNOSIS — K5900 Constipation, unspecified: Secondary | ICD-10-CM | POA: Diagnosis not present

## 2021-03-26 DIAGNOSIS — Z01810 Encounter for preprocedural cardiovascular examination: Secondary | ICD-10-CM | POA: Diagnosis not present

## 2021-03-26 LAB — CBC
HCT: 38.4 % — ABNORMAL LOW (ref 39.0–52.0)
Hemoglobin: 12.9 g/dL — ABNORMAL LOW (ref 13.0–17.0)
MCH: 30.9 pg (ref 26.0–34.0)
MCHC: 33.6 g/dL (ref 30.0–36.0)
MCV: 92.1 fL (ref 80.0–100.0)
Platelets: 194 10*3/uL (ref 150–400)
RBC: 4.17 MIL/uL — ABNORMAL LOW (ref 4.22–5.81)
RDW: 13.3 % (ref 11.5–15.5)
WBC: 6.3 10*3/uL (ref 4.0–10.5)
nRBC: 0 % (ref 0.0–0.2)

## 2021-03-26 LAB — COMPREHENSIVE METABOLIC PANEL
ALT: 13 U/L (ref 0–44)
AST: 20 U/L (ref 15–41)
Albumin: 3.8 g/dL (ref 3.5–5.0)
Alkaline Phosphatase: 57 U/L (ref 38–126)
Anion gap: 10 (ref 5–15)
BUN: 18 mg/dL (ref 8–23)
CO2: 26 mmol/L (ref 22–32)
Calcium: 9.1 mg/dL (ref 8.9–10.3)
Chloride: 102 mmol/L (ref 98–111)
Creatinine, Ser: 1.2 mg/dL (ref 0.61–1.24)
GFR, Estimated: >60 mL/min (ref >60)
Glucose, Bld: 93 mg/dL (ref 70–99)
Potassium: 4.5 mmol/L (ref 3.5–5.1)
Sodium: 138 mmol/L (ref 135–145)
Total Bilirubin: 0.5 mg/dL (ref 0.3–1.2)
Total Protein: 7 g/dL (ref 6.5–8.1)

## 2021-03-26 LAB — CREATININE, SERUM
Creatinine, Ser: 1.15 mg/dL (ref 0.61–1.24)
GFR, Estimated: >60 mL/min (ref >60)

## 2021-03-26 LAB — LIPASE, BLOOD: Lipase: 46 U/L (ref 11–51)

## 2021-03-26 LAB — RESP PANEL BY RT-PCR (FLU A&B, COVID) ARPGX2
Influenza A by PCR: NEGATIVE
Influenza B by PCR: NEGATIVE
SARS Coronavirus 2 by RT PCR: NEGATIVE

## 2021-03-26 LAB — CBC WITH DIFFERENTIAL/PLATELET
Abs Immature Granulocytes: 0.01 10*3/uL (ref 0.00–0.07)
Basophils Absolute: 0 10*3/uL (ref 0.0–0.1)
Basophils Relative: 1 %
Eosinophils Absolute: 0.1 10*3/uL (ref 0.0–0.5)
Eosinophils Relative: 2 %
HCT: 38.4 % — ABNORMAL LOW (ref 39.0–52.0)
Hemoglobin: 13.1 g/dL (ref 13.0–17.0)
Immature Granulocytes: 0 %
Lymphocytes Relative: 31 %
Lymphs Abs: 1.8 10*3/uL (ref 0.7–4.0)
MCH: 31.3 pg (ref 26.0–34.0)
MCHC: 34.1 g/dL (ref 30.0–36.0)
MCV: 91.9 fL (ref 80.0–100.0)
Monocytes Absolute: 0.6 10*3/uL (ref 0.1–1.0)
Monocytes Relative: 11 %
Neutro Abs: 3.2 10*3/uL (ref 1.7–7.7)
Neutrophils Relative %: 55 %
Platelets: 166 10*3/uL (ref 150–400)
RBC: 4.18 MIL/uL — ABNORMAL LOW (ref 4.22–5.81)
RDW: 13.3 % (ref 11.5–15.5)
WBC: 5.7 10*3/uL (ref 4.0–10.5)
nRBC: 0 % (ref 0.0–0.2)

## 2021-03-26 LAB — CBG MONITORING, ED: Glucose-Capillary: 87 mg/dL (ref 70–99)

## 2021-03-26 LAB — MAGNESIUM: Magnesium: 2.4 mg/dL (ref 1.7–2.4)

## 2021-03-26 LAB — TROPONIN I (HIGH SENSITIVITY)
Troponin I (High Sensitivity): 8 ng/L (ref <18)
Troponin I (High Sensitivity): 10 ng/L (ref <18)

## 2021-03-26 LAB — BRAIN NATRIURETIC PEPTIDE: B Natriuretic Peptide: 90 pg/mL (ref 0.0–100.0)

## 2021-03-26 MED ORDER — ASPIRIN 81 MG PO CHEW
81.0000 mg | CHEWABLE_TABLET | ORAL | Status: AC
  Administered 2021-03-27: 81 mg via ORAL

## 2021-03-26 MED ORDER — SODIUM CHLORIDE 0.9% FLUSH
3.0000 mL | Freq: Two times a day (BID) | INTRAVENOUS | Status: DC
  Administered 2021-03-26 – 2021-03-28 (×5): 3 mL via INTRAVENOUS

## 2021-03-26 MED ORDER — ACETAMINOPHEN 325 MG PO TABS: 650.0000 mg | ORAL_TABLET | Freq: Four times a day (QID) | ORAL | Status: DC | PRN

## 2021-03-26 MED ORDER — LEVOTHYROXINE SODIUM 75 MCG PO TABS
75.0000 ug | ORAL_TABLET | Freq: Every day | ORAL | Status: DC
  Administered 2021-03-27 – 2021-04-02 (×6): 75 ug via ORAL
  Filled 2021-03-26 (×7): qty 1

## 2021-03-26 MED ORDER — NITROGLYCERIN 0.4 MG SL SUBL
0.4000 mg | SUBLINGUAL_TABLET | SUBLINGUAL | Status: DC | PRN
Start: 2021-03-26 — End: 2021-03-29

## 2021-03-26 MED ORDER — SODIUM CHLORIDE 0.9 % WEIGHT BASED INFUSION
3.0000 mL/kg/h | INTRAVENOUS | Status: DC
  Administered 2021-03-27: 3 mL/kg/h via INTRAVENOUS

## 2021-03-26 MED ORDER — ACETAMINOPHEN 650 MG RE SUPP: 650.0000 mg | Freq: Four times a day (QID) | RECTAL | Status: DC | PRN

## 2021-03-26 MED ORDER — ACETAMINOPHEN 325 MG PO TABS
650.0000 mg | ORAL_TABLET | ORAL | Status: DC | PRN
  Administered 2021-03-26: 650 mg via ORAL
  Filled 2021-03-26: qty 2

## 2021-03-26 MED ORDER — HEPARIN SODIUM (PORCINE) 5000 UNIT/ML IJ SOLN
5000.0000 [IU] | Freq: Three times a day (TID) | INTRAMUSCULAR | Status: DC
  Administered 2021-03-26 – 2021-03-28 (×7): 5000 [IU] via SUBCUTANEOUS
  Filled 2021-03-26 (×8): qty 1

## 2021-03-26 MED ORDER — PANTOPRAZOLE SODIUM 40 MG PO TBEC
80.0000 mg | DELAYED_RELEASE_TABLET | Freq: Every day | ORAL | Status: DC
  Administered 2021-03-27 – 2021-03-28 (×2): 80 mg via ORAL
  Filled 2021-03-26 (×2): qty 2

## 2021-03-26 MED ORDER — SODIUM CHLORIDE 0.9 % IV SOLN: 250.0000 mL | INTRAVENOUS | Status: DC | PRN

## 2021-03-26 MED ORDER — ASPIRIN EC 81 MG PO TBEC
81.0000 mg | DELAYED_RELEASE_TABLET | Freq: Every day | ORAL | Status: DC
Start: 2021-03-26 — End: 2021-03-29
  Administered 2021-03-26 – 2021-03-28 (×2): 81 mg via ORAL
  Filled 2021-03-26 (×2): qty 1

## 2021-03-26 MED ORDER — VITAMIN B-12 1000 MCG PO TABS
1000.0000 ug | ORAL_TABLET | Freq: Every day | ORAL | Status: DC
  Administered 2021-03-27 – 2021-03-28 (×2): 1000 ug via ORAL
  Filled 2021-03-26 (×2): qty 1

## 2021-03-26 MED ORDER — ONDANSETRON HCL 4 MG/2ML IJ SOLN: 4.0000 mg | Freq: Four times a day (QID) | INTRAMUSCULAR | Status: DC | PRN

## 2021-03-26 MED ORDER — SODIUM CHLORIDE 0.9 % WEIGHT BASED INFUSION
1.0000 mL/kg/h | INTRAVENOUS | Status: DC
  Administered 2021-03-27: 1 mL/kg/h via INTRAVENOUS

## 2021-03-26 MED ORDER — CARVEDILOL 3.125 MG PO TABS
3.1250 mg | ORAL_TABLET | Freq: Two times a day (BID) | ORAL | Status: DC
Start: 2021-03-26 — End: 2021-03-29
  Administered 2021-03-26 – 2021-03-29 (×6): 3.125 mg via ORAL
  Filled 2021-03-26 (×6): qty 1

## 2021-03-26 MED ORDER — SODIUM CHLORIDE 0.9% FLUSH: 3.0000 mL | INTRAVENOUS | Status: DC | PRN

## 2021-03-26 NOTE — ED Notes (Addendum)
ED TO INPATIENT HANDOFF REPORT  ED Nurse Name and Phone #:  Bonnita Nasuti, RN S Name/Age/Gender Steven Valenzuela 85 y.o. male Room/Bed: MH09/MH09  Code Status   Code Status: Not on file  Home/SNF/Other Home Patient oriented to: self, place, time and situation Is this baseline? Yes   Triage Complete: Triage complete  Chief Complaint Chest pain of uncertain etiology [id number]  Triage Note Pt states was picking up sticks on Saturday and started having chest pain, shortness of breath and pain down bilateral arm pain. Pain went away and now pain to left chest and left arm. Left groin and left leg pain intermittently x 1 month as well.    Allergies Allergies  Allergen Reactions  . Atorvastatin Other (See Comments)    Chest pain Chest pain   . Bactrim [Sulfamethoxazole-Trimethoprim]   . Prednisone Other (See Comments)    Anxiety  . Sulfa Antibiotics     Level of Care/Admitting Diagnosis ED Disposition    ED Disposition  Admit   Condition  --   Comment  Hospital Area: [facility] [[id number]]  Level of Care: Telemetry Cardiac [[id number]]  Interfacility transfer: Yes  May place patient in observation at [facility] or [place] if equivalent level of care is available:: No  Covid Evaluation: Asymptomatic Screening Protocol (No Symptoms)  Diagnosis: Chest pain of uncertain etiology 123XX123  Admitting Physician: Karmen Bongo [[id number]]  Attending Physician: Karmen Bongo [[id number]]         B Medical/Surgery History Past Medical History:  Diagnosis Date  . Anemia of unknown etiology 06/26/2020   Formatting of this note might be different from the original. Likely of CKD  . Atherosclerosis of both carotid arteries 09/27/2016   Formatting of this note might be different from the original. Noted. Mild Not obstructive.  . Balance problem 10/11/2015  . Benign prostatic hyperplasia without urinary obstruction 01/15/2016  . Cardiac arrhythmia 01/15/2016  . Carpal tunnel  syndrome 01/15/2016  . Degenerative cervical disc 09/25/2016  . Diverticulosis   . ED (erectile dysfunction) of organic origin 01/15/2016  . Essential (primary) hypertension 01/15/2016  . Gastroduodenitis   . GERD (gastroesophageal reflux disease)   . History of bleeding peptic ulcer 08/01/2020   Formatting of this note might be different from the original. [place] around 2020.  Marland Kitchen Hypogonadism in male 01/15/2016  . Hypothyroidism, unspecified 01/15/2016  . Internal hemorrhoids   . Lumbar radiculopathy, chronic 02/21/2016  . Neuropathy   . Pure hypercholesterolemia, unspecified 01/15/2016  . Stage 3b chronic kidney disease ([place]) 06/26/2020  . Unilateral inguinal hernia, without obstruction or gangrene, not specified as recurrent 01/15/2016  . Vitamin B12 deficiency    Past Surgical History:  Procedure Laterality Date  . COLONOSCOPY  10/10/2014   Colonic post status polypectomy. Mild predominantly sigmoid diverticulosis. Otherwise normal colonoscopt to TI.  Marland Kitchen ESOPHAGOGASTRODUODENOSCOPY  10/20/2008   Irregular Zline suggestive of GERD. Otherwise normal EGD.  Marland Kitchen SHOULDER SURGERY Right 06/25/2019  . SKIN SURGERY     Skin cancer removed from Right side of face  . UPPER GASTROINTESTINAL ENDOSCOPY     10 yrs ago     A IV Location/Drains/Wounds Patient Lines/Drains/Airways Status    Active Line/Drains/Airways    Name Placement date Placement time Site Days   Peripheral IV 03/07/2021 20 G 1" Left Antecubital 03/18/2021  1135  Antecubital  less than 1          Intake/Output Last 24 hours No intake or output data in the 24 hours ending  03/21/2021 1416  Labs/Imaging Results for orders placed or performed during the hospital encounter of 03/22/2021 (from the past 48 hour(s))  CBG monitoring, ED     Status: None   Collection Time: 03/08/2021 11:14 AM  Result Value Ref Range   Glucose-Capillary 87 70 - 99 mg/dL    Comment: Glucose reference range applies only to samples taken after fasting for  at least 8 hours.  Troponin I (High Sensitivity)     Status: None   Collection Time: 03/09/2021 11:32 AM  Result Value Ref Range   Troponin I (High Sensitivity) 8 <18 ng/L    Comment: (NOTE) Elevated high sensitivity troponin I (hsTnI) values and significant  changes across serial measurements may suggest ACS but many other  chronic and acute conditions are known to elevate hsTnI results.  Refer to the "Links" section for chest pain algorithms and additional  guidance. Performed at [facility], [address] [place], [place] [address]   Comprehensive metabolic panel     Status: None   Collection Time: 03/08/2021 11:32 AM  Result Value Ref Range   Sodium 138 135 - 145 mmol/L   Potassium 4.5 3.5 - 5.1 mmol/L   Chloride 102 98 - 111 mmol/L   CO2 26 22 - 32 mmol/L   Glucose, Bld 93 70 - 99 mg/dL    Comment: REPEATED TO VERIFY   BUN 18 8 - 23 mg/dL   Creatinine, Ser 1.20 0.61 - 1.24 mg/dL   Calcium 9.1 8.9 - 10.3 mg/dL   Total Protein 7.0 6.5 - 8.1 g/dL   Albumin 3.8 3.5 - 5.0 g/dL   AST 20 15 - 41 U/L   ALT 13 0 - 44 U/L   Alkaline Phosphatase 57 38 - 126 U/L   Total Bilirubin 0.5 0.3 - 1.2 mg/dL   GFR, Estimated >60 >60 mL/min    Comment: (NOTE) Calculated using the CKD-EPI Creatinine Equation (2021)    Anion gap 10 5 - 15    Comment: Performed at [facility], [address] [place], [place] [address]  Lipase, blood     Status: None   Collection Time: 03/19/2021 11:32 AM  Result Value Ref Range   Lipase 46 11 - 51 U/L    Comment: Performed at [facility], [address] [place], [place] [address]  Brain natriuretic peptide     Status: None   Collection Time: 03/12/2021 11:32 AM  Result Value Ref Range   B Natriuretic Peptide 90.0 0.0 - 100.0 pg/mL    Comment: Performed at [facility], [address] [place], [place] [address]  CBC with Differential     Status: Abnormal   Collection Time: 04/04/2021 11:32 AM  Result Value Ref  Range   WBC 5.7 4.0 - 10.5 K/uL   RBC 4.18 (L) 4.22 - 5.81 MIL/uL   Hemoglobin 13.1 13.0 - 17.0 g/dL   HCT 38.4 (L) 39.0 - 52.0 %   MCV 91.9 80.0 - 100.0 fL   MCH 31.3 26.0 - 34.0 pg   MCHC 34.1 30.0 - 36.0 g/dL   RDW 13.3 11.5 - 15.5 %   Platelets 166 150 - 400 K/uL   nRBC 0.0 0.0 - 0.2 %   Neutrophils Relative % 55 %   Neutro Abs 3.2 1.7 - 7.7 K/uL   Lymphocytes Relative 31 %   Lymphs Abs 1.8 0.7 - 4.0 K/uL   Monocytes Relative 11 %  Monocytes Absolute 0.6 0.1 - 1.0 K/uL   Eosinophils Relative 2 %   Eosinophils Absolute 0.1 0.0 - 0.5 K/uL   Basophils Relative 1 %   Basophils Absolute 0.0 0.0 - 0.1 K/uL   Immature Granulocytes 0 %   Abs Immature Granulocytes 0.01 0.00 - 0.07 K/uL    Comment: Performed at [facility], [address] [place], [place] [address]   DG Chest 2 View  Result Date: 03/10/2021 CLINICAL DATA:  Chest pain and dyspnea EXAM: CHEST - 2 VIEW COMPARISON:  12/25/2020 chest radiograph. FINDINGS: Stable cardiomediastinal silhouette with normal heart size. No pneumothorax. No pleural effusion. Lungs appear clear, with no acute consolidative airspace disease and no pulmonary edema. IMPRESSION: No active cardiopulmonary disease. Electronically Signed   By: Ilona Sorrel M.D.   On: 03/17/2021 12:12    Pending Labs Unresulted Labs (From admission, onward)   None      Vitals/Pain Today's Vitals   03/18/2021 1230 03/31/2021 1300 03/23/2021 1330 03/28/2021 1400  BP: (!) 156/72 (!) 147/75 (!) 170/84 (!) 175/78  Pulse: 93 94 (!) 52 (!) 51  Resp: '16 17 16 16  '$ Temp:      TempSrc:      SpO2: 100% 99% 100% 99%  Weight:      Height:      PainSc:        Isolation Precautions No active isolations  Medications Medications - No data to display  Mobility walks Low fall risk   Focused Assessments Cardiac Assessment Handoff:    No results found for: CKTOTAL, CKMB, CKMBINDEX, TROPONINI No results found for: DDIMER Does the Patient currently have chest  pain? No      R Recommendations: See Admitting Provider Note  Report given to:   Additional Notes:

## 2021-03-26 NOTE — H&P (Signed)
Cardiology Admission History and Physical:   Patient ID: Steven Valenzuela MRN: [id number]; DOB: 29-May-1936   Admission date: 03/28/2021  PCP:  Raina Mina., MD   [facility] Providers Cardiologist: Dr. Bettina Gavia  Chief Complaint: Chest pain  Patient Profile:   Steven Valenzuela is a 85 y.o. male with hypertension, hyperlipidemia, hypothyroidism, GERD with prior gastritis who is being seen 03/28/2021 for the evaluation of chest pain.  Patient has established care with Dr. Bettina Gavia April 2022 for symptoms concerning for angina.  Follow-up stress test was low risk.  He is treated medically.  EGD February 2021 showed erosive gastritis.  Biopsy negative for H. pylori.  History of Present Illness:   Steven Valenzuela presented for evaluation of chest pain.  He reports exertional chest tightness and shortness of breath for past 3 to 4 months which have progressively got worsened.  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  Saturday he had a worse episode while picking up small tree branches.  He had chest tightness radiating across his chest.  Then it radiated to his arm bilaterally.  This was associated with diaphoresis or shortness of breath.  Symptoms resolved with rest after 1 hour.  He did not try sublingual nitroglycerin.  He did not fell over the weekend and had intermittent chest discomfort.  This morning he woke up with chest achiness associated with shortness of breath and achiness.  Symptoms lasted for few hours with eventual resolution.  He came to [facility] for further evaluation.  High-sensitivity troponin negative BNP 90 Potassium 4.5 Serum creatinine 1.2 LFTs normal Hemoglobin 13.1 Respiratory panel negative for influenza   Past Medical History:  Diagnosis Date   Anemia of unknown etiology 06/26/2020   Formatting of this note might be different from the original. Likely of CKD   Atherosclerosis of both carotid arteries 09/27/2016   Formatting of this  note might be different from the original. Noted. Mild Not obstructive.   Balance problem 10/11/2015   Benign prostatic hyperplasia without urinary obstruction 01/15/2016   Cardiac arrhythmia 01/15/2016   Carpal tunnel syndrome 01/15/2016   Degenerative cervical disc 09/25/2016   Diverticulosis    ED (erectile dysfunction) of organic origin 01/15/2016   Essential (primary) hypertension 01/15/2016   Gastroduodenitis    GERD (gastroesophageal reflux disease)    History of bleeding peptic ulcer 08/01/2020   Formatting of this note might be different from the original. Lyndel Safe around 2020.   Hypogonadism in male 01/15/2016   Hypothyroidism, unspecified 01/15/2016   Internal hemorrhoids    Lumbar radiculopathy, chronic 02/21/2016   Neuropathy    Pure hypercholesterolemia, unspecified 01/15/2016   Stage 3b chronic kidney disease ([place]) 06/26/2020   Unilateral inguinal hernia, without obstruction or gangrene, not specified as recurrent 01/15/2016   Vitamin B12 deficiency     Past Surgical History:  Procedure Laterality Date   COLONOSCOPY  10/10/2014   Colonic post status polypectomy. Mild predominantly sigmoid diverticulosis. Otherwise normal colonoscopt to TI.   ESOPHAGOGASTRODUODENOSCOPY  10/20/2008   Irregular Zline suggestive of GERD. Otherwise normal EGD.   SHOULDER SURGERY Right 06/25/2019   SKIN SURGERY     Skin cancer removed from Right side of face   UPPER GASTROINTESTINAL ENDOSCOPY     10 yrs ago     Medications Prior to Admission: Prior to Admission medications   Medication Sig Start Date End Date Taking? Authorizing Provider  atenolol (TENORMIN) 50 MG tablet Take 50 mg by mouth daily. 06/26/20  [provider]  cyanocobalamin 1000 MCG tablet Take 1,000 mcg by mouth daily.    [provider]  esomeprazole (NEXIUM) 40 MG packet Take 40 mg by mouth daily before breakfast. 09/17/19   Jackquline Denmark, MD  isosorbide mononitrate (IMDUR) 30 MG 24 hr tablet Take 1  tablet (30 mg total) by mouth daily. 01/03/21 04/03/21  Richardo Priest, MD  levothyroxine (SYNTHROID) 75 MCG tablet Take 75 mcg by mouth daily before breakfast.    [provider]  nitroGLYCERIN (NITROSTAT) 0.4 MG SL tablet Place 1 tablet (0.4 mg total) under the tongue every 5 (five) minutes as needed for chest pain. 11/24/20 02/22/21  Richardo Priest, MD     Allergies:    Allergies  Allergen Reactions   Atorvastatin Other (See Comments)    Chest pain Chest pain    Bactrim [Sulfamethoxazole-Trimethoprim]    Prednisone Other (See Comments)    Anxiety   Sulfa Antibiotics     Social History:   Social History   Socioeconomic History   Marital status: Married    Spouse name: Not on file   Number of children: Not on file   Years of education: Not on file   Highest education level: Not on file  Occupational History   Not on file  Tobacco Use   Smoking status: Former   Smokeless tobacco: Never  Vaping Use   Vaping Use: Never used  Substance and Sexual Activity   Alcohol use: Not Currently   Drug use: Never   Sexual activity: Not on file  Other Topics Concern   Not on file  Social History Narrative   Not on file   Social Determinants of Health   Financial Resource Strain: Not on file  Food Insecurity: Not on file  Transportation Needs: Not on file  Physical Activity: Not on file  Stress: Not on file  Social Connections: Not on file  Intimate Partner Violence: Not on file    Family History:   The patient's family history is negative for Colon cancer, Colon polyps, Esophageal cancer, Rectal cancer, and Stomach cancer.    ROS:  Please see the history of present illness.  All other ROS reviewed and negative.     Physical Exam/Data:   Vitals:   03/31/2021 1400 03/28/2021 1500 03/15/2021 1530 03/06/2021 1700  BP: (!) 175/78 (!) 171/76 (!) 158/73 (!) 183/76  Pulse: (!) 51 (!) 53 (!) 55 70  Resp: '16 13 19 17  '$ Temp:    98.5 F (36.9 C)  TempSrc:    Oral  SpO2: 99%  98% 98% 98%  Weight:    81.5 kg  Height:    5' 10.5" (1.791 m)   No intake or output data in the 24 hours ending 03/24/2021 1929 Last 3 Weights 03/25/2021 03/09/2021 01/03/2021  Weight (lbs) 179 lb 10.8 oz 180 lb 180 lb  Weight (kg) 81.5 kg 81.647 kg 81.647 kg     Body mass index is 25.42 kg/m.  General:  Well nourished, well developed, in no acute distress HEENT: normal Lymph: no adenopathy Neck: no JVD Endocrine:  No thryomegaly Vascular: No carotid bruits; FA pulses 2+ bilaterally without bruits  Cardiac:  normal S1, S2; RRR; no murmur  Lungs:  clear to auscultation bilaterally, no wheezing, rhonchi or rales  Abd: soft, nontender, no hepatomegaly  Ext: no edema Musculoskeletal:  No deformities, BUE and BLE strength normal and equal Skin: warm and dry  Neuro:  CNs 2-12 intact, no focal abnormalities noted  Psych:  Normal affect    EKG:  The ECG that was done today  was personally reviewed and demonstrates sinus bradycardia at rate of 55 bpm  Relevant CV Studies:  Stress test  12/04/2020 The left ventricular ejection fraction is hyperdynamic (>65%). Nuclear stress EF: 70%. Blood pressure demonstrated a normal response to exercise. There was no ST segment deviation noted during stress. The study is normal. This is a low risk study.   Normal stress nuclear study with no ischemia or infarction; EF 70 with normal wall motion.  Laboratory Data:  High Sensitivity Troponin:   Recent Labs  Lab 03/31/2021 1132 03/18/2021 1353  TROPONINIHS 8 10      Chemistry Recent Labs  Lab 04/03/2021 1132  NA 138  K 4.5  CL 102  CO2 26  GLUCOSE 93  BUN 18  CREATININE 1.20  CALCIUM 9.1  GFRNONAA >60  ANIONGAP 10    Recent Labs  Lab 03/20/2021 1132  PROT 7.0  ALBUMIN 3.8  AST 20  ALT 13  ALKPHOS 57  BILITOT 0.5   Hematology Recent Labs  Lab 03/11/2021 1132  WBC 5.7  RBC 4.18*  HGB 13.1  HCT 38.4*  MCV 91.9  MCH 31.3  MCHC 34.1  RDW 13.3  PLT 166   BNP Recent Labs  Lab  03/20/2021 1132  BNP 90.0    DDimer No results for input(s): DDIMER in the last 168 hours.   Radiology/Studies:  DG Chest 2 View  Result Date: 04/03/2021 CLINICAL DATA:  Chest pain and dyspnea EXAM: CHEST - 2 VIEW COMPARISON:  12/25/2020 chest radiograph. FINDINGS: Stable cardiomediastinal silhouette with normal heart size. No pneumothorax. No pleural effusion. Lungs appear clear, with no acute consolidative airspace disease and no pulmonary edema. IMPRESSION: No active cardiopulmonary disease. Electronically Signed   By: Ilona Sorrel M.D.   On: 03/22/2021 12:12     Assessment and Plan:   Unstable angina Has classic anginal symptoms.  This has progressively worsened and now occurring with rest.  Worse episode on Saturday.  Troponin negative.  EKG without acute ischemic changes.  Recent stress test was low risk.  Patient reports prior history of gastritis and intolerance to aspirin.  However, no recurrence since taking PPI. - The patient understands that risks include but are not limited to stroke (1 in 1000), death (1 in 47), kidney failure [usually temporary] (1 in 500), bleeding (1 in 200), allergic reaction [possibly serious] (1 in 200), and agrees to proceed.    -Will start enteric-coated aspirin 81 mg daily -Transition home atenolol (intermittently bradycardic in 40s) to carvedilol -Reports history of intolerance to Lipitor> Check lipid panel>> he is willing to try other agent at low-dose if evidence of CAD -Get echocardiogram -Currently chest pain-free.  Patient is unsure if he is taking Imdur at home or not.  If recurrent chest pain, will start Nitropaste.  2.  Hypertension -Blood pressure elevated -Start carvedilol (switch her from atenolol)  3.  Hyperlipidemia -As summarized above   Risk Assessment/Risk Scores:   TIMI Risk Score for Unstable Angina or Non-ST Elevation MI:   The patient's TIMI risk score is 3, which indicates a 13% risk of all cause mortality, new or  recurrent myocardial infarction or need for urgent revascularization in the next 14 days.{  Severity of Illness: The appropriate patient status for this patient is OBSERVATION. Observation status is judged to be reasonable and necessary in order to provide the required intensity of service to ensure the patient's safety.  The patient's presenting symptoms, physical exam findings, and initial radiographic and laboratory data in the context of their medical condition is felt to place them at decreased risk for further clinical deterioration. Furthermore, it is anticipated that the patient will be medically stable for discharge from the hospital within 2 midnights of admission. The following factors support the patient status of observation.   " The patient's presenting symptoms include chest pain, shortness of breath " The physical exam findings include none " The initial radiographic and laboratory data are none  For questions or updates, please contact [place] Please consult [email or web address] for contact info under     Jarrett Soho, [place]  03/23/2021 7:29 PM

## 2021-03-26 NOTE — ED Provider Notes (Signed)
In[facility] EMERGENCY DEPARTMENT Provider Note   CSN: [id number] Arrival date & time: 03/17/2021  1102     History Chief Complaint  Patient presents with   Chest Pain    Steven Valenzuela is an 85 y.o. male with a past medical history of a cardiac arrhythmia of nonspecific etiology, and previous anginal chest pain who present with chest pain and numbness, soreness of bilateral arms on Saturday afternoon he was picking up sticks.  The patient reports the pain resolved with rest after about 1 hour.  The patient underwent a cardiac stress test two months ago which showed no specific abnormalities. He was scheduled for a CT coronary evaluation. Today the patient reports he woke up with some chest pain and shortness of breath, and chest tightness  He reports there is some radiation into the left arm.  When asked to describe the pain he reports it "does not feel right" but denies specific numbness, pain, or tingling.  He does endorse some pain in the left leg radiating from his buttocks down to the back of his left knee.  He endorses a long history of back pain and reports that this is not a new symptom for him.  Patient denies history of diabetes, does not smoke tobacco, has not had a previous stroke, he does have hyperlipidemia and has refused treatment for this in the past.  He has an active prescription of nitroglycerin but reports he has never taken it.  When asked whether he has ever had chest pain at rest he reports that he occasionally will have some chest pain a few minutes after eating while he is sitting on the couch.  He reports that this does not feel like heartburn to him.  He denies cough, he denies hemoptysis, he denies pain or swelling of his calves.  He does report some minor lower leg edema.   Chest Pain Associated symptoms: back pain, palpitations and shortness of breath   Associated symptoms: no abdominal pain, no diaphoresis, no dysphagia, no fever, no nausea, no  vomiting and no weakness       Past Medical History:  Diagnosis Date   Anemia of unknown etiology 06/26/2020   Formatting of this note might be different from the original. Likely of CKD   Atherosclerosis of both carotid arteries 09/27/2016   Formatting of this note might be different from the original. Noted. Mild Not obstructive.   Balance problem 10/11/2015   Benign prostatic hyperplasia without urinary obstruction 01/15/2016   Cardiac arrhythmia 01/15/2016   Carpal tunnel syndrome 01/15/2016   Degenerative cervical disc 09/25/2016   Diverticulosis    ED (erectile dysfunction) of organic origin 01/15/2016   Essential (primary) hypertension 01/15/2016   Gastroduodenitis    GERD (gastroesophageal reflux disease)    History of bleeding peptic ulcer 08/01/2020   Formatting of this note might be different from the original. Lyndel Safe around 2020.   Hypogonadism in male 01/15/2016   Hypothyroidism, unspecified 01/15/2016   Internal hemorrhoids    Lumbar radiculopathy, chronic 02/21/2016   Neuropathy    Pure hypercholesterolemia, unspecified 01/15/2016   Stage 3b chronic kidney disease ([place]) 06/26/2020   Unilateral inguinal hernia, without obstruction or gangrene, not specified as recurrent 01/15/2016   Vitamin B12 deficiency     Patient Active Problem List   Diagnosis Date Noted   Chest pain of uncertain etiology [id number]   Vitamin B12 deficiency    Thyroid disease    Internal hemorrhoids    Gastroduodenitis  Diverticulosis    Anemia    Hypercholesteremia    History of bleeding peptic ulcer 08/01/2020   Anemia of unknown etiology 06/26/2020   Stage 3b chronic kidney disease ([place]) 06/26/2020   Atherosclerosis of both carotid arteries 09/27/2016   Degenerative cervical disc 09/25/2016   Lumbar radiculopathy, chronic 02/21/2016   Benign prostatic hyperplasia without urinary obstruction 01/15/2016   Cardiac arrhythmia 01/15/2016   Carpal tunnel syndrome 01/15/2016    Deficiency of other specified B group vitamins 01/15/2016   ED (erectile dysfunction) of organic origin 01/15/2016   Essential (primary) hypertension 01/15/2016   GERD (gastroesophageal reflux disease) 01/15/2016   Hypertrophy of breast 01/15/2016   Hypogonadism in male 01/15/2016   Hypothyroidism, unspecified 01/15/2016   Neuropathy 01/15/2016   Pure hypercholesterolemia, unspecified 01/15/2016   Unilateral inguinal hernia, without obstruction or gangrene, not specified as recurrent 01/15/2016   Balance problem 10/11/2015   Numbness in feet 10/11/2015   Pain in both feet 10/11/2015    Past Surgical History:  Procedure Laterality Date   COLONOSCOPY  10/10/2014   Colonic post status polypectomy. Mild predominantly sigmoid diverticulosis. Otherwise normal colonoscopt to TI.   ESOPHAGOGASTRODUODENOSCOPY  10/20/2008   Irregular Zline suggestive of GERD. Otherwise normal EGD.   SHOULDER SURGERY Right 06/25/2019   SKIN SURGERY     Skin cancer removed from Right side of face   UPPER GASTROINTESTINAL ENDOSCOPY     10 yrs ago       Family History  Problem Relation Age of Onset   Colon cancer Neg Hx    Colon polyps Neg Hx    Esophageal cancer Neg Hx    Rectal cancer Neg Hx    Stomach cancer Neg Hx     Social History   Tobacco Use   Smoking status: Former   Smokeless tobacco: Never  Scientific laboratory technician Use: Never used  Substance Use Topics   Alcohol use: Not Currently   Drug use: Never    Home Medications Prior to Admission medications   Medication Sig Start Date End Date Taking? Authorizing Provider  atenolol (TENORMIN) 50 MG tablet Take 50 mg by mouth daily. 06/26/20   [provider]  cyanocobalamin 1000 MCG tablet Take 1,000 mcg by mouth daily.    [provider]  esomeprazole (NEXIUM) 40 MG packet Take 40 mg by mouth daily before breakfast. 09/17/19   Jackquline Denmark, MD  isosorbide mononitrate (IMDUR) 30 MG 24 hr tablet Take 1 tablet (30 mg total) by  mouth daily. 01/03/21 04/03/21  Richardo Priest, MD  levothyroxine (SYNTHROID) 75 MCG tablet Take 75 mcg by mouth daily before breakfast.    [provider]  nitroGLYCERIN (NITROSTAT) 0.4 MG SL tablet Place 1 tablet (0.4 mg total) under the tongue every 5 (five) minutes as needed for chest pain. 11/24/20 02/22/21  Richardo Priest, MD    Allergies    Atorvastatin, Bactrim [sulfamethoxazole-trimethoprim], Prednisone, and Sulfa antibiotics  Review of Systems   Review of Systems  Constitutional:  Negative for diaphoresis and fever.  HENT:  Negative for trouble swallowing.   Respiratory:  Positive for chest tightness and shortness of breath.   Cardiovascular:  Positive for chest pain and palpitations.  Gastrointestinal:  Negative for abdominal pain, nausea and vomiting.  Genitourinary:  Negative for difficulty urinating.  Musculoskeletal:  Positive for back pain. Negative for neck pain and neck stiffness.  Skin:  Negative for color change and pallor.  Neurological:  Negative for syncope and weakness.  Physical Exam Updated Vital Signs BP (!) 175/78 (BP Location: Right Arm)   Pulse (!) 51   Temp 97.9 F (36.6 C) (Oral)   Resp 16   Ht '5\' 10"'$  (1.778 m)   Wt 81.6 kg   SpO2 99%   BMI 25.83 kg/m   Physical Exam Vitals and nursing note reviewed.  Constitutional:      General: He is not in acute distress.    Appearance: Normal appearance.  HENT:     Head: Normocephalic and atraumatic.  Eyes:     General:        Right eye: No discharge.        Left eye: No discharge.  Cardiovascular:     Rate and Rhythm: Normal rate. Rhythm irregular.     Heart sounds: No murmur heard.   No friction rub. No gallop.     Comments: Patient with some arrhythmia, potential premature beats.  He has long stretches of normal rate and rhythm however.  He endorses a known history of this arrhythmia and denies that he has been diagnosed with atrial fibrillation.  Pulses 2+ in bilateral upper and lower  extremities. Pulmonary:     Effort: Pulmonary effort is normal.     Breath sounds: Normal breath sounds.  Chest:     Comments: No tenderness to palpation of the chest wall. Abdominal:     General: Bowel sounds are normal.     Palpations: Abdomen is soft.  Skin:    General: Skin is warm and dry.     Capillary Refill: Capillary refill takes less than 2 seconds.  Neurological:     Mental Status: He is alert and oriented to person, place, and time.  Psychiatric:        Mood and Affect: Mood normal.        Behavior: Behavior normal.    ED Results / Procedures / Treatments   Labs (all labs ordered are listed, but only abnormal results are displayed) Labs Reviewed  CBC WITH DIFFERENTIAL/PLATELET - Abnormal; Notable for the following components:      Result Value   RBC 4.18 (*)    HCT 38.4 (*)    All other components within normal limits  COMPREHENSIVE METABOLIC PANEL  LIPASE, BLOOD  BRAIN NATRIURETIC PEPTIDE  CBG MONITORING, ED  TROPONIN I (HIGH SENSITIVITY)  TROPONIN I (HIGH SENSITIVITY)    EKG EKG Interpretation  Date/Time:  Monday March 26 2021 11:13:05 EDT Ventricular Rate:  55 PR Interval:  170 QRS Duration: 86 QT Interval:  458 QTC Calculation: 439 R Axis:   -5 Text Interpretation: Sinus rhythm Abnormal R-wave progression, early transition Confirmed by Lennice Sites ([id number]) on 03/22/2021 11:24:41 AM  Radiology DG Chest 2 View  Result Date: 03/07/2021 CLINICAL DATA:  Chest pain and dyspnea EXAM: CHEST - 2 VIEW COMPARISON:  12/25/2020 chest radiograph. FINDINGS: Stable cardiomediastinal silhouette with normal heart size. No pneumothorax. No pleural effusion. Lungs appear clear, with no acute consolidative airspace disease and no pulmonary edema. IMPRESSION: No active cardiopulmonary disease. Electronically Signed   By: Ilona Sorrel M.D.   On: 03/06/2021 12:12    Procedures Procedures   Medications Ordered in ED Medications - No data to display  ED Course  I  have reviewed the triage vital signs and the nursing notes.  Pertinent labs & imaging results that were available during my care of the patient were reviewed by me and considered in my medical decision making (see chart for details).    MDM  Rules/Calculators/A&P                         Initial lab work-up normal BNP normal troponin x 2, normal chest x-ray, normal EKG.  Patient is negative for suspicion of PE by Wells criteria.  Based on his description of pain and physical exam do not favor gastrointestinal origin.  Patient denies history of heartburn.  Patient is 85 years old and has a heart score of 5.  Due to patient's description of anginal chest pain that is worse with exertion, believe he warrants further work-up with either a repeat stress test or the coronary CT study that he was scheduled for.  Since patient has already been followed by cardiology he will consult with cardiology and discussed admission to medicine.  After discussion, medicine has accepted plan to admit and pursue further workup for his high risk chest pain. Final Clinical Impression(s) / ED Diagnoses Final diagnoses:  Chest pain, unspecified type    Rx / DC Orders ED Discharge Orders     None        Anselmo Pickler, PA-C 03/25/2021 1439    Lennice Sites, DO 03/24/2021 1459

## 2021-03-26 NOTE — Progress Notes (Signed)
I was notified by Leanor Kail of cardiology that the cardiology service will be admitting this patient for further evaluation and management of his angina.  Hospitalist service not consulted at this time, and is currently not involved in this patient's care.    Babs Bertin, DO Hospitalist

## 2021-03-26 NOTE — ED Provider Notes (Addendum)
I personally evaluated the patient during the encounter and completed a history, physical, procedures, medical decision making to contribute to the overall care of the patient and decision making for the patient briefly, the patient is a 85 y.o. male with history of hypertension high cholesterol, CKD presents the ED with chest pain.  Unremarkable vitals except for hypertension.  EKG shows sinus rhythm.  No ischemic changes.  Had an episode of exertional chest pain lasted about an hour over the weekend.  Woke up again this morning with some chest pain radiating to his left arm.  Chest pain radiated to both arms this past Saturday.  Has no current chest pain now.  Does also endorse some intermittent left groin pain recently as well but no pain now.  He has no asymmetric swelling to his legs.  He has good pulses in his lower extremities.  Overall he appears comfortable.  Suspect may be some muscular skeletal process going on his left lower leg.  No concern for DVT.  History and physical is not consistent with dissection.  He has no PE risk factors.  He has had recent work-up with cardiology with recent stress test that was unremarkable.  He is now endorsing may be more of an exertional component with his chest pain.  Cardiology note seems that patient is supposed to get a CT coronary study.  We will get basic labs and troponin in anticipation of cardiology about further recommendations.  Labs and troponin unremarkable.  Talked with Dr. Debara Pickett with cardiology who recommends admission to medicine team.  They will consult.  As long as troponin is within normal limits can hold off on heparin especially if he is chest pain-free.  Likely will consider heart cath.   This chart was dictated using voice recognition software.  Despite best efforts to proofread,  errors can occur which can change the documentation meaning.    EKG Interpretation  Date/Time:  Monday March 26 2021 11:13:05 EDT Ventricular Rate:  55 PR  Interval:  170 QRS Duration: 86 QT Interval:  458 QTC Calculation: 439 R Axis:   -5 Text Interpretation: Sinus rhythm Abnormal R-wave progression, early transition Confirmed by Lennice Sites ([id number]) on 03/06/2021 11:24:41 AM            Lennice Sites, DO 03/31/2021 [place], [place], DO 03/16/2021 [id number]

## 2021-03-26 NOTE — ED Triage Notes (Signed)
Pt states was picking up sticks on Saturday and started having chest pain, shortness of breath and pain down bilateral arm pain. Pain went away and now pain to left chest and left arm. Left groin and left leg pain intermittently x 1 month as well.

## 2021-03-26 NOTE — Telephone Encounter (Signed)
Called patient back after speaking with Dr. Bettina Gavia. His recommendations are for the pt to go to the [facility] to be evaluated. Pt verbalized understanding.

## 2021-03-26 NOTE — Telephone Encounter (Addendum)
Spoke with patient, he was experiencing chest pain and pain down to his elbows on Saturday, he did not take NTG. He has been experiencing chest pain today as well, no arm pain. Pt wants to be seen to get a stress test done, pt told her may need an appt since he was seen 2 months ago. Will relay message to Dr. Bettina Gavia. Pt encouraged to go to Emergency Department if the chest pain gets worse.

## 2021-03-26 NOTE — Progress Notes (Signed)
Received a phone call from Facility: [facility]  Requesting MD: Ronnald Nian Patient with h/o HTN; HLD; hypothyroidism; and stage 3B CKD presenting with chest pain.  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  Currently CP free, negative EKG and troponin.  Dr. Debara Pickett recommends cardiology consultation with medical admission.  He does not recommend heparin at this time.   Plan of care: CP obs, possible diagnostic cath.  Needs cardiology consult upon arrival. The patient will be accepted for admission to telemetry at [facility] when bed is available.   Nursing staff, Please call the [place] number at the top of Amion at the time of the patient's arrival so that the patient can be paged to the admitting physician.   Carlyon Shadow, M.D. [facility]

## 2021-03-26 NOTE — Telephone Encounter (Signed)
Pt c/o of Chest Pain: STAT if CP now or developed within 24 hours  1. Are you having CP right now? no  2. Are you experiencing any other symptoms (ex. SOB, nausea, vomiting, sweating)? sweating  3. How long have you been experiencing CP? Saturday   4. Is your CP continuous or coming and going? Coming and going  5. Have you taken Nitroglycerin? N/a  Dr office called on behalf of patient being in there office looking to set him up with an appt for dr Bettina Gavia. He also experiencing bilateral arm pain radiation. Please advise ?

## 2021-03-27 ENCOUNTER — Encounter (HOSPITAL_COMMUNITY)
Admission: EM | Disposition: E | Payer: Self-pay | Source: Home / Self Care | Attending: Thoracic Surgery (Cardiothoracic Vascular Surgery)

## 2021-03-27 ENCOUNTER — Observation Stay (HOSPITAL_COMMUNITY): Payer: PPO

## 2021-03-27 DIAGNOSIS — I97120 Postprocedural cardiac arrest following cardiac surgery: Secondary | ICD-10-CM | POA: Diagnosis not present

## 2021-03-27 DIAGNOSIS — I200 Unstable angina: Secondary | ICD-10-CM | POA: Diagnosis not present

## 2021-03-27 DIAGNOSIS — I459 Conduction disorder, unspecified: Secondary | ICD-10-CM | POA: Diagnosis not present

## 2021-03-27 DIAGNOSIS — D6959 Other secondary thrombocytopenia: Secondary | ICD-10-CM | POA: Diagnosis not present

## 2021-03-27 DIAGNOSIS — Z85828 Personal history of other malignant neoplasm of skin: Secondary | ICD-10-CM | POA: Diagnosis not present

## 2021-03-27 DIAGNOSIS — I25110 Atherosclerotic heart disease of native coronary artery with unstable angina pectoris: Secondary | ICD-10-CM | POA: Diagnosis present

## 2021-03-27 DIAGNOSIS — R911 Solitary pulmonary nodule: Secondary | ICD-10-CM | POA: Diagnosis not present

## 2021-03-27 DIAGNOSIS — K219 Gastro-esophageal reflux disease without esophagitis: Secondary | ICD-10-CM | POA: Diagnosis present

## 2021-03-27 DIAGNOSIS — Z20822 Contact with and (suspected) exposure to covid-19: Secondary | ICD-10-CM | POA: Diagnosis present

## 2021-03-27 DIAGNOSIS — J90 Pleural effusion, not elsewhere classified: Secondary | ICD-10-CM | POA: Diagnosis not present

## 2021-03-27 DIAGNOSIS — R079 Chest pain, unspecified: Secondary | ICD-10-CM | POA: Diagnosis present

## 2021-03-27 DIAGNOSIS — E8770 Fluid overload, unspecified: Secondary | ICD-10-CM | POA: Diagnosis not present

## 2021-03-27 DIAGNOSIS — I491 Atrial premature depolarization: Secondary | ICD-10-CM | POA: Diagnosis not present

## 2021-03-27 DIAGNOSIS — N400 Enlarged prostate without lower urinary tract symptoms: Secondary | ICD-10-CM | POA: Diagnosis present

## 2021-03-27 DIAGNOSIS — E039 Hypothyroidism, unspecified: Secondary | ICD-10-CM | POA: Diagnosis present

## 2021-03-27 DIAGNOSIS — I97611 Postprocedural hemorrhage and hematoma of a circulatory system organ or structure following cardiac bypass: Secondary | ICD-10-CM | POA: Diagnosis not present

## 2021-03-27 DIAGNOSIS — E782 Mixed hyperlipidemia: Secondary | ICD-10-CM | POA: Diagnosis present

## 2021-03-27 DIAGNOSIS — I10 Essential (primary) hypertension: Secondary | ICD-10-CM | POA: Diagnosis not present

## 2021-03-27 DIAGNOSIS — R0602 Shortness of breath: Secondary | ICD-10-CM | POA: Diagnosis not present

## 2021-03-27 DIAGNOSIS — M79605 Pain in left leg: Secondary | ICD-10-CM | POA: Diagnosis not present

## 2021-03-27 DIAGNOSIS — I6523 Occlusion and stenosis of bilateral carotid arteries: Secondary | ICD-10-CM | POA: Diagnosis present

## 2021-03-27 DIAGNOSIS — N183 Chronic kidney disease, stage 3 unspecified: Secondary | ICD-10-CM | POA: Diagnosis not present

## 2021-03-27 DIAGNOSIS — I469 Cardiac arrest, cause unspecified: Secondary | ICD-10-CM | POA: Diagnosis not present

## 2021-03-27 DIAGNOSIS — Y838 Other surgical procedures as the cause of abnormal reaction of the patient, or of later complication, without mention of misadventure at the time of the procedure: Secondary | ICD-10-CM | POA: Diagnosis not present

## 2021-03-27 DIAGNOSIS — Z888 Allergy status to other drugs, medicaments and biological substances status: Secondary | ICD-10-CM | POA: Diagnosis not present

## 2021-03-27 DIAGNOSIS — Z01810 Encounter for preprocedural cardiovascular examination: Secondary | ICD-10-CM | POA: Diagnosis not present

## 2021-03-27 DIAGNOSIS — D62 Acute posthemorrhagic anemia: Secondary | ICD-10-CM | POA: Diagnosis not present

## 2021-03-27 DIAGNOSIS — N1832 Chronic kidney disease, stage 3b: Secondary | ICD-10-CM | POA: Diagnosis present

## 2021-03-27 DIAGNOSIS — I314 Cardiac tamponade: Secondary | ICD-10-CM | POA: Diagnosis not present

## 2021-03-27 DIAGNOSIS — K5900 Constipation, unspecified: Secondary | ICD-10-CM | POA: Diagnosis not present

## 2021-03-27 DIAGNOSIS — I319 Disease of pericardium, unspecified: Secondary | ICD-10-CM | POA: Diagnosis not present

## 2021-03-27 DIAGNOSIS — E785 Hyperlipidemia, unspecified: Secondary | ICD-10-CM | POA: Diagnosis not present

## 2021-03-27 DIAGNOSIS — I340 Nonrheumatic mitral (valve) insufficiency: Secondary | ICD-10-CM | POA: Diagnosis present

## 2021-03-27 DIAGNOSIS — I129 Hypertensive chronic kidney disease with stage 1 through stage 4 chronic kidney disease, or unspecified chronic kidney disease: Secondary | ICD-10-CM | POA: Diagnosis present

## 2021-03-27 DIAGNOSIS — Y713 Surgical instruments, materials and cardiovascular devices (including sutures) associated with adverse incidents: Secondary | ICD-10-CM | POA: Diagnosis not present

## 2021-03-27 DIAGNOSIS — Z951 Presence of aortocoronary bypass graft: Secondary | ICD-10-CM | POA: Diagnosis not present

## 2021-03-27 DIAGNOSIS — J9811 Atelectasis: Secondary | ICD-10-CM | POA: Diagnosis not present

## 2021-03-27 DIAGNOSIS — Z87891 Personal history of nicotine dependence: Secondary | ICD-10-CM | POA: Diagnosis not present

## 2021-03-27 DIAGNOSIS — I973 Postprocedural hypertension: Secondary | ICD-10-CM | POA: Diagnosis not present

## 2021-03-27 HISTORY — PX: LEFT HEART CATH AND CORONARY ANGIOGRAPHY: CATH118249

## 2021-03-27 LAB — CBC
HCT: 35.4 % — ABNORMAL LOW (ref 39.0–52.0)
Hemoglobin: 12.3 g/dL — ABNORMAL LOW (ref 13.0–17.0)
MCH: 31.8 pg (ref 26.0–34.0)
MCHC: 34.7 g/dL (ref 30.0–36.0)
MCV: 91.5 fL (ref 80.0–100.0)
Platelets: 169 10*3/uL (ref 150–400)
RBC: 3.87 MIL/uL — ABNORMAL LOW (ref 4.22–5.81)
RDW: 13.2 % (ref 11.5–15.5)
WBC: 5.6 10*3/uL (ref 4.0–10.5)
nRBC: 0 % (ref 0.0–0.2)

## 2021-03-27 LAB — ECHOCARDIOGRAM COMPLETE
Area-P 1/2: 3.08 cm2
Height: 70.5 in
S' Lateral: 2.8 cm
Weight: 2809.6 oz

## 2021-03-27 LAB — HEMOGLOBIN A1C
Hgb A1c MFr Bld: 5.7 % — ABNORMAL HIGH (ref 4.8–5.6)
Mean Plasma Glucose: 116.89 mg/dL

## 2021-03-27 LAB — LIPID PANEL
Cholesterol: 229 mg/dL — ABNORMAL HIGH (ref 0–200)
HDL: 26 mg/dL — ABNORMAL LOW (ref >40)
LDL Cholesterol: 142 mg/dL — ABNORMAL HIGH (ref 0–99)
Total CHOL/HDL Ratio: 8.8 RATIO
Triglycerides: 304 mg/dL — ABNORMAL HIGH (ref <150)
VLDL: 61 mg/dL — ABNORMAL HIGH (ref 0–40)

## 2021-03-27 LAB — BASIC METABOLIC PANEL
Anion gap: 5 (ref 5–15)
BUN: 16 mg/dL (ref 8–23)
CO2: 28 mmol/L (ref 22–32)
Calcium: 8.9 mg/dL (ref 8.9–10.3)
Chloride: 107 mmol/L (ref 98–111)
Creatinine, Ser: 1.25 mg/dL — ABNORMAL HIGH (ref 0.61–1.24)
GFR, Estimated: 57 mL/min — ABNORMAL LOW (ref >60)
Glucose, Bld: 93 mg/dL (ref 70–99)
Potassium: 3.8 mmol/L (ref 3.5–5.1)
Sodium: 140 mmol/L (ref 135–145)

## 2021-03-27 LAB — TSH: TSH: 2.558 u[IU]/mL (ref 0.350–4.500)

## 2021-03-27 SURGERY — LEFT HEART CATH AND CORONARY ANGIOGRAPHY
Anesthesia: LOCAL

## 2021-03-27 MED ORDER — SODIUM CHLORIDE 0.9% FLUSH
3.0000 mL | Freq: Two times a day (BID) | INTRAVENOUS | Status: DC
  Administered 2021-03-27 – 2021-03-28 (×4): 3 mL via INTRAVENOUS

## 2021-03-27 MED ORDER — VERAPAMIL HCL 2.5 MG/ML IV SOLN
INTRAVENOUS | Status: DC | PRN
  Administered 2021-03-27: 10 mL via INTRA_ARTERIAL

## 2021-03-27 MED ORDER — ONDANSETRON HCL 4 MG/2ML IJ SOLN
4.0000 mg | Freq: Four times a day (QID) | INTRAMUSCULAR | Status: DC | PRN
  Filled 2021-03-27: qty 2

## 2021-03-27 MED ORDER — HEPARIN SODIUM (PORCINE) 1000 UNIT/ML IJ SOLN
INTRAMUSCULAR | Status: DC | PRN
  Administered 2021-03-27: 4000 [IU] via INTRAVENOUS

## 2021-03-27 MED ORDER — MIDAZOLAM HCL 2 MG/2ML IJ SOLN
INTRAMUSCULAR | Status: DC | PRN
  Administered 2021-03-27: 1 mg via INTRAVENOUS

## 2021-03-27 MED ORDER — HYDRALAZINE HCL 20 MG/ML IJ SOLN: 10.0000 mg | INTRAMUSCULAR | Status: AC | PRN

## 2021-03-27 MED ORDER — SODIUM CHLORIDE 0.9% FLUSH: 3.0000 mL | INTRAVENOUS | Status: DC | PRN

## 2021-03-27 MED ORDER — FENTANYL CITRATE (PF) 100 MCG/2ML IJ SOLN
INTRAMUSCULAR | Status: DC | PRN
  Administered 2021-03-27: 25 ug via INTRAVENOUS

## 2021-03-27 MED ORDER — ACETAMINOPHEN 325 MG PO TABS: 650.0000 mg | ORAL_TABLET | ORAL | Status: DC | PRN

## 2021-03-27 MED ORDER — SODIUM CHLORIDE 0.9 % IV SOLN
250.0000 mL | INTRAVENOUS | Status: DC | PRN
Start: 2021-03-27 — End: 2021-03-29

## 2021-03-27 MED ORDER — LIDOCAINE HCL (PF) 1 % IJ SOLN
INTRAMUSCULAR | Status: AC
  Filled 2021-03-27: qty 30

## 2021-03-27 MED ORDER — IOHEXOL 350 MG/ML SOLN
INTRAVENOUS | Status: DC | PRN
  Administered 2021-03-27: 70 mL

## 2021-03-27 MED ORDER — FENTANYL CITRATE (PF) 100 MCG/2ML IJ SOLN
INTRAMUSCULAR | Status: AC
  Filled 2021-03-27: qty 2

## 2021-03-27 MED ORDER — VERAPAMIL HCL 2.5 MG/ML IV SOLN
INTRAVENOUS | Status: AC
  Filled 2021-03-27: qty 2

## 2021-03-27 MED ORDER — HEPARIN (PORCINE) IN NACL 1000-0.9 UT/500ML-% IV SOLN
INTRAVENOUS | Status: DC | PRN
  Administered 2021-03-27 (×2): 500 mL

## 2021-03-27 MED ORDER — ALPRAZOLAM 0.25 MG PO TABS
0.2500 mg | ORAL_TABLET | Freq: Every evening | ORAL | Status: DC | PRN
  Administered 2021-03-27 – 2021-03-28 (×2): 0.25 mg via ORAL
  Filled 2021-03-27 (×2): qty 1

## 2021-03-27 MED ORDER — SODIUM CHLORIDE 0.9 % IV SOLN: INTRAVENOUS | Status: AC

## 2021-03-27 MED ORDER — HEPARIN SODIUM (PORCINE) 1000 UNIT/ML IJ SOLN
INTRAMUSCULAR | Status: AC
  Filled 2021-03-27: qty 1

## 2021-03-27 MED ORDER — MIDAZOLAM HCL 2 MG/2ML IJ SOLN
INTRAMUSCULAR | Status: AC
  Filled 2021-03-27: qty 2

## 2021-03-27 MED ORDER — LABETALOL HCL 5 MG/ML IV SOLN: 10.0000 mg | INTRAVENOUS | Status: AC | PRN

## 2021-03-27 MED ORDER — LIDOCAINE HCL (PF) 1 % IJ SOLN
INTRAMUSCULAR | Status: DC | PRN
  Administered 2021-03-27: 2 mL

## 2021-03-27 SURGICAL SUPPLY — 10 items
CATH 5FR JL3.5 JR4 ANG PIG MP (CATHETERS) ×1 IMPLANT
DEVICE RAD COMP TR BAND LRG (VASCULAR PRODUCTS) ×1 IMPLANT
GLIDESHEATH SLEND SS 6F .021 (SHEATH) ×1 IMPLANT
GUIDEWIRE INQWIRE 1.5J.035X260 (WIRE) IMPLANT
INQWIRE 1.5J .035X260CM (WIRE) ×4
KIT HEART LEFT (KITS) ×2 IMPLANT
PACK CARDIAC CATHETERIZATION (CUSTOM PROCEDURE TRAY) ×2 IMPLANT
TRANSDUCER W/STOPCOCK (MISCELLANEOUS) ×2 IMPLANT
TUBING CIL FLEX 10 FLL-RA (TUBING) ×2 IMPLANT
WIRE HI TORQ VERSACORE-J 145CM (WIRE) ×1 IMPLANT

## 2021-03-27 NOTE — Progress Notes (Signed)
  Echocardiogram 2D Echocardiogram has been performed.  Steven Valenzuela 03/13/2021, 2:37 PM

## 2021-03-27 NOTE — Progress Notes (Signed)
T[facility] consulted for CABG evaluation. °

## 2021-03-27 NOTE — Consult Note (Addendum)
[address]       [place] [id number]             [phone]        Dionta Heckart [facility]ree Medical Record [id number] Date of Birth: 1936/05/24  Referring: Jettie Booze, MD Primary Care: Raina Mina., MD Primary Cardiologist: Dr. Shirlee More  Chief Complaint:  Chest pain    History of Present Illness:     Mr. Laban Mcclaughry is a very pleasant 85 year old male with past history significant for hypertension, dyslipidemia, stage III chronic kidney disease.  He has been having some exertional chest discomfort and tightness for about a month.  He had a more intense episode of chest pain all the way across his chest and radiating down both arms this past Saturday, 03/24/2021.  He contacted his primary care physician was advised to proceed to the [facility].  He was evaluated there with an EKG that showed sinus rhythm and no ischemic changes.  Chest x-ray was unremarkable.  High-sensitivity troponin level was also normal.  The BNP was 90.  He was hypertensive with a systolic blood pressure in the 180s.  Transfer to [facility] for further evaluation was recommended.  He remained stable following transfer.  He was admitted by the cardiology service.  Left heart catheterization was recommended and carried out earlier today.  This demonstrates three-vessel coronary artery disease with preserved LV function.  There is a 70% proximal RCA stenosis followed by high-grade stenoses in the distal RCA.  There was a 70% left main coronary artery stenosis followed by 75% mid LAD stenosis and 75% proximal circumflex stenosis.  CT surgery has been asked to evaluate Mr. Shuey for consideration of operative revascularization.  Currently, Mr. Ater is resting in bed with his family at the bedside.  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  He reports he has been healthy overall.  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  He has never had any type of surgery.  He does report having some superficial varicosities in his right medial thigh and in his left lower leg.      Current Activity/ Functional Status: Patient is independent with mobility/ambulation, transfers, ADL's, IADL's.   Zubrod Score: At the time of surgery this patient's most appropriate activity status/level should be described as: '[]'$     0    Normal activity, no symptoms '[x]'$     1    Restricted in physical strenuous activity but ambulatory, able to do out light work '[]'$     2    Ambulatory and capable of self care, unable to do work activities, up and about                 more than 50%  Of the time                            '[]'$     3    Only limited self care, in bed greater than 50% of waking hours '[]'$     4    Completely disabled, no self care, confined to bed or chair '[]'$     5    Moribund  Past Medical History:  Diagnosis Date   Anemia of unknown etiology 06/26/2020  Formatting of this note might be different from the original. Likely of CKD   Atherosclerosis of both carotid arteries 09/27/2016   Formatting of this note might be different from the original. Noted. Mild Not obstructive.   Balance problem 10/11/2015   Benign prostatic hyperplasia without urinary obstruction 01/15/2016   Cardiac arrhythmia 01/15/2016   Carpal tunnel syndrome 01/15/2016   Degenerative cervical disc 09/25/2016   Diverticulosis    ED (erectile dysfunction) of organic origin 01/15/2016   Essential (primary) hypertension 01/15/2016   Gastroduodenitis    GERD (gastroesophageal reflux disease)    History of bleeding peptic ulcer 08/01/2020   Formatting of this note might be different from the original. Lyndel Safe around 2020.   Hypogonadism in male 01/15/2016   Hypothyroidism, unspecified 01/15/2016   Internal hemorrhoids    Lumbar radiculopathy, chronic 02/21/2016    Neuropathy    Pure hypercholesterolemia, unspecified 01/15/2016   Stage 3b chronic kidney disease ([place]) 06/26/2020   Unilateral inguinal hernia, without obstruction or gangrene, not specified as recurrent 01/15/2016   Vitamin B12 deficiency     Past Surgical History:  Procedure Laterality Date   COLONOSCOPY  10/10/2014   Colonic post status polypectomy. Mild predominantly sigmoid diverticulosis. Otherwise normal colonoscopt to TI.   ESOPHAGOGASTRODUODENOSCOPY  10/20/2008   Irregular Zline suggestive of GERD. Otherwise normal EGD.   SHOULDER SURGERY Right 06/25/2019   SKIN SURGERY     Skin cancer removed from Right side of face   UPPER GASTROINTESTINAL ENDOSCOPY     10 yrs ago    Social History   Tobacco Use  Smoking Status Former  Smokeless Tobacco Never    Social History   Substance and Sexual Activity  Alcohol Use Not Currently     Allergies  Allergen Reactions   Atorvastatin Other (See Comments)    Chest pain Chest pain    Bactrim [Sulfamethoxazole-Trimethoprim]    Prednisone Other (See Comments)    Anxiety   Sulfa Antibiotics     Current Facility-Administered Medications  Medication Dose Route Frequency Provider Last Rate Last Admin   0.9 %  sodium chloride infusion   Intravenous Continuous Larae Grooms S, MD       0.9 %  sodium chloride infusion  250 mL Intravenous PRN Jettie Booze, MD       acetaminophen (TYLENOL) tablet 650 mg  650 mg Oral Q4H PRN Jettie Booze, MD   650 mg at 03/16/2021 2010   acetaminophen (TYLENOL) tablet 650 mg  650 mg Oral Q4H PRN Jettie Booze, MD       aspirin EC tablet 81 mg  81 mg Oral Daily Jettie Booze, MD   81 mg at 03/09/2021 2010   carvedilol (COREG) tablet 3.125 mg  3.125 mg Oral BID WC Jettie Booze, MD   3.125 mg at 03/17/2021 1129   heparin injection 5,000 Units  5,000 Units Subcutaneous Q8H Jettie Booze, MD   5,000 Units at 03/16/2021 [id number]   hydrALAZINE (APRESOLINE) injection 10  mg  10 mg Intravenous Q20 Min PRN Jettie Booze, MD       labetalol (NORMODYNE) injection 10 mg  10 mg Intravenous Q10 min PRN Jettie Booze, MD       levothyroxine (SYNTHROID) tablet 75 mcg  75 mcg Oral QAC breakfast Jettie Booze, MD   75 mcg at 03/07/2021 0618   nitroGLYCERIN (NITROSTAT) SL tablet 0.4 mg  0.4 mg Sublingual Q5 Min x 3 PRN Jettie Booze, MD  ondansetron (ZOFRAN) injection 4 mg  4 mg Intravenous Q6H PRN Jettie Booze, MD       pantoprazole (PROTONIX) EC tablet 80 mg  80 mg Oral QAC breakfast Jettie Booze, MD   80 mg at 03/06/2021 0617   sodium chloride flush (NS) 0.9 % injection 3 mL  3 mL Intravenous Q12H Jettie Booze, MD   3 mL at 03/22/2021 1000   sodium chloride flush (NS) 0.9 % injection 3 mL  3 mL Intravenous Q12H Jettie Booze, MD       sodium chloride flush (NS) 0.9 % injection 3 mL  3 mL Intravenous PRN Jettie Booze, MD       vitamin B-12 (CYANOCOBALAMIN) tablet 1,000 mcg  1,000 mcg Oral Daily Larae Grooms S, MD   1,000 mcg at 04/04/2021 1129    Medications Prior to Admission  Medication Sig Dispense Refill Last Dose   atenolol (TENORMIN) 50 MG tablet Take 50 mg by mouth daily.      cyanocobalamin 1000 MCG tablet Take 1,000 mcg by mouth daily.      esomeprazole (NEXIUM) 40 MG packet Take 40 mg by mouth daily before breakfast. 30 each 11    isosorbide mononitrate (IMDUR) 30 MG 24 hr tablet Take 1 tablet (30 mg total) by mouth daily. 90 tablet 3    levothyroxine (SYNTHROID) 75 MCG tablet Take 75 mcg by mouth daily before breakfast.      nitroGLYCERIN (NITROSTAT) 0.4 MG SL tablet Place 1 tablet (0.4 mg total) under the tongue every 5 (five) minutes as needed for chest pain. 25 tablet 3     Family History  Problem Relation Age of Onset   Colon cancer Neg Hx    Colon polyps Neg Hx    Esophageal cancer Neg Hx    Rectal cancer Neg Hx    Stomach cancer Neg Hx      Review of Systems:   ROS      Cardiac Review of Systems: Y or  [    ]= no  Chest Pain [   x ]  Resting SOB [   ] Exertional SOB  [ x ]  Orthopnea [  ]   Pedal Edema [   ]    Palpitations [  ] Syncope  [  ]   Presyncope [   ]  General Review of Systems: [Y] = yes [  ]=no Constitional: recent weight change [  ]; anorexia [  ]; fatigue [  ]; nausea [  ]; night sweats [  ]; fever [  ]; or chills [  ]                                                               Dental: Last Dentist visit: Recent..  Plate dentures, partial plate on the bottom.  Says he needs a few fillings but has put this off.  Currently having no dental issues.  Eye : blurred vision [  ]; diplopia [   ]; vision changes [  ];  Amaurosis fugax[  ]; Resp: cough [  ];  wheezing[  ];  hemoptysis[  ]; shortness of breath[  ]; paroxysmal nocturnal dyspnea[  ]; dyspnea on exertion[  ]; or orthopnea[  ];  GI:  gallstones[  ], vomiting[  ];  dysphagia[  ]; melena[  ];  hematochezia [  ]; heartburn[  ];   Hx of  Colonoscopy[  ]; GU: kidney stones [  ]; hematuria[  ];   dysuria [  ];  nocturia[  ];  history of     obstruction [  ]; urinary frequency [  ]             Skin: rash, swelling[  ];, hair loss[  ];  peripheral edema[  ];  or itching[  ]; Musculosketetal: myalgias[  ];  joint swelling[  ];  joint erythema[  ];  joint pain[  ];  back pain[  ];  Heme/Lymph: bruising[  ];  bleeding[  ];  anemia[  ];  Neuro: TIA[  ];  headaches[  ];  stroke[  ];  vertigo[  ];  seizures[  ];   paresthesias[  ];  difficulty walking[  ];  Psych:depression[  ]; anxiety[  ];  Endocrine: diabetes[  ];  thyroid dysfunction[  ];                 Physical Exam: BP (!) 144/70 (BP Location: Left Wrist)   Pulse (!) 57   Temp 97.8 F (36.6 C) (Oral)   Resp 16   Ht 5' 10.5" (1.791 m)   Wt 79.7 kg   SpO2 97%   BMI 24.84 kg/m    General appearance: alert, cooperative, and no distress Head: Normocephalic, without obvious abnormality, atraumatic Neck: no adenopathy, no carotid bruit, no  JVD, supple, symmetrical, trachea midline, and thyroid not enlarged, symmetric, no tenderness/mass/nodules Lymph nodes: No cervical or clavicular adenopathy Resp: clear to auscultation bilaterally Cardio: regular rate and rhythm, S1, S2 normal, no murmur, click, rub or gallop GI: soft, non-tender; bowel sounds normal; no masses,  no organomegaly Extremities: All are well perfused with palpable pulses throughout.  He has a superficial varicosity in the region of the saphenous vein in his right thigh and also some varicosities in his left lower leg near the saphenous vein. Neurologic: Grossly normal  Diagnostic Studies & Laboratory data:  LEFT HEART CATH AND CORONARY ANGIOGRAPHY   Conclusion      Ost RCA to Prox RCA lesion is 70% stenosed.   Dist RCA-1 lesion is 90% stenosed.   Dist RCA-2 lesion is 75% stenosed.   Prox Cx lesion is 75% stenosed.   1st Mrg lesion is 80% stenosed.   Ost LM to Mid LM lesion is 70% stenosed.   Mid LAD lesion is 75% stenosed.   Prox LAD lesion is 50% stenosed.   The left ventricular systolic function is normal.   LV end diastolic pressure is normal.   The left ventricular ejection fraction is 55-65% by visual estimate.   There is no aortic valve stenosis.   Severe multivessel CAD including ostial LAD disease that causes significant pressure dampening.    Plan for cardiac surgery consult.    Indications  Unstable angina (HCC) [I20.0 (ICD-10-CM)]   Procedural Details  Technical Details The risks, benefits, and details of the procedure were explained to the patient.  The patient verbalized understanding and wanted to proceed.  Informed written consent was obtained.  PROCEDURE TECHNIQUE:  After Xylocaine anesthesia a 62F slender sheath was placed in the right radial artery with a single anterior needle wall stick.   IV Heparin was given.  Left ventriculography was done using a JR4 catheter.  Right coronary angiography was done using a Judkins  R4 guide  catheter.  Left coronary angiography was done using a Judkins L3.5 guide catheter.   A TR band was used for hemostasis.  Contrast: 70 cc   Estimated blood loss <50 mL.   During this procedure medications were administered to achieve and maintain moderate conscious sedation while the patient's heart rate, blood pressure, and oxygen saturation were continuously monitored and I was present face-to-face 100% of this time.   Medications (Filter: Administrations occurring from 1024 to 1104 on 03/05/2021)  Heparin (Porcine) in NaCl 1000-0.9 UT/500ML-% SOLN (mL) Total volume:  1,000 mL  Date/Time Rate/Dose/Volume Action   03/12/2021 1031 500 mL Given   1031 500 mL Given    fentaNYL (SUBLIMAZE) injection (mcg) Total dose:  25 mcg  Date/Time Rate/Dose/Volume Action   03/07/2021 1034 25 mcg Given    midazolam (VERSED) injection (mg) Total dose:  1 mg  Date/Time Rate/Dose/Volume Action   03/24/2021 1034 1 mg Given    lidocaine (PF) (XYLOCAINE) 1 % injection (mL) Total volume:  2 mL  Date/Time Rate/Dose/Volume Action   03/24/2021 1036 2 mL Given    Radial Cocktail/Verapamil only (mL) Total volume:  10 mL  Date/Time Rate/Dose/Volume Action   03/20/2021 1040 10 mL Given    heparin sodium (porcine) injection (Units) Total dose:  4,000 Units  Date/Time Rate/Dose/Volume Action   03/15/2021 1043 4,000 Units Given    iohexol (OMNIPAQUE) 350 MG/ML injection (mL) Total volume:  70 mL  Date/Time Rate/Dose/Volume Action   03/23/2021 1100 70 mL Given    fentaNYL (SUBLIMAZE) injection (mcg) Total dose:  25 mcg  Date/Time Rate/Dose/Volume Action   03/16/2021 1047 25 mcg Given    acetaminophen (TYLENOL) tablet 650 mg (mg) Total dose:  Cannot be calculated* Dosing weight:  81.5  *Administration dose not documented Date/Time Rate/Dose/Volume Action   03/20/2021 1024 *Not included in total MAR Hold    aspirin EC tablet 81 mg (mg) Total dose:  Cannot be calculated* Dosing weight:   81.5  *Administration dose not documented Date/Time Rate/Dose/Volume Action   03/06/2021 1024 *Not included in total MAR Hold    carvedilol (COREG) tablet 3.125 mg (mg) Total dose:  Cannot be calculated* Dosing weight:  81.5  *Administration dose not documented Date/Time Rate/Dose/Volume Action   03/17/2021 1024 *Not included in total MAR Hold    heparin injection 5,000 Units (Units) Total dose:  Cannot be calculated* Dosing weight:  81.5  *Administration dose not documented Date/Time Rate/Dose/Volume Action   03/11/2021 1024 *Not included in total MAR Hold    levothyroxine (SYNTHROID) tablet 75 mcg (mcg) Total dose:  Cannot be calculated*  *Administration dose not documented Date/Time Rate/Dose/Volume Action   03/28/2021 1024 *Not included in total MAR Hold    nitroGLYCERIN (NITROSTAT) SL tablet 0.4 mg (mg) Total dose:  Cannot be calculated* Dosing weight:  81.5  *Administration dose not documented Date/Time Rate/Dose/Volume Action   03/19/2021 1024 *Not included in total MAR Hold    pantoprazole (PROTONIX) EC tablet 80 mg (mg) Total dose:  Cannot be calculated* Dosing weight:  81.5  *Administration dose not documented Date/Time Rate/Dose/Volume Action   03/25/2021 1024 *Not included in total MAR Hold    sodium chloride flush (NS) 0.9 % injection 3 mL (mL) Total dose:  Cannot be calculated* Dosing weight:  81.5  *Administration dose not documented Date/Time Rate/Dose/Volume Action   04/03/2021 1024 *Not included in total MAR Hold    vitamin B-12 (CYANOCOBALAMIN) tablet 1,000 mcg (mcg) Total dose:  Cannot be calculated*  *Administration dose  not documented Date/Time Rate/Dose/Volume Action   03/13/2021 1024 *Not included in total MAR Hold    ondansetron (ZOFRAN) injection 4 mg (mg) Total dose:  Cannot be calculated* Dosing weight:  81.5  *Administration dose not documented Date/Time Rate/Dose/Volume Action   03/14/2021 1024 *Not included in total MAR Hold     Sedation  Time  Sedation Time Physician-1: 22 minutes 47 seconds   Radiation/Fluoro  Fluoro time: 4.5 (min) DAP: 13037 (mGycm2) Cumulative Air Kerma: 223 (mGy)   Coronary Findings   Diagnostic Dominance: Right  Left Main  Ost LM to Mid LM lesion is 70% stenosed. Significant pressure dampening with catheter engagement.  Left Anterior Descending  Prox LAD lesion is 50% stenosed.  Mid LAD lesion is 75% stenosed.  Left Circumflex  Prox Cx lesion is 75% stenosed.  First Obtuse Marginal Branch  1st Mrg lesion is 80% stenosed.  Right Coronary Artery  Ost RCA to Prox RCA lesion is 70% stenosed. The lesion is moderately calcified.  Dist RCA-1 lesion is 90% stenosed.  Dist RCA-2 lesion is 75% stenosed.   Intervention   No interventions have been documented.  Wall Motion              Left Heart  Left Ventricle The left ventricular size is normal. The left ventricular systolic function is normal. LV end diastolic pressure is normal. The left ventricular ejection fraction is 55-65% by visual estimate. No regional wall motion abnormalities.  Aortic Valve There is no aortic valve stenosis.   Coronary Diagrams   Diagnostic Dominance: Right          Recent Radiology Findings:    DG Chest 2 View  Result Date: 03/06/2021 CLINICAL DATA:  Chest pain and dyspnea EXAM: CHEST - 2 VIEW COMPARISON:  12/25/2020 chest radiograph. FINDINGS: Stable cardiomediastinal silhouette with normal heart size. No pneumothorax. No pleural effusion. Lungs appear clear, with no acute consolidative airspace disease and no pulmonary edema. IMPRESSION: No active cardiopulmonary disease. Electronically Signed   By: Ilona Sorrel M.D.   On: 03/21/2021 12:12     I have independently reviewed the above radiologic studies and discussed with the patient   Recent Lab Findings: Lab Results  Component Value Date   WBC 5.6 03/05/2021   HGB 12.3 (L) 03/25/2021   HCT 35.4 (L) 03/23/2021   PLT 169 03/11/2021    GLUCOSE 93 03/18/2021   CHOL 229 (H) 03/23/2021   TRIG 304 (H) 03/24/2021   HDL 26 (L) 03/22/2021   LDLCALC 142 (H) 03/14/2021   ALT 13 03/15/2021   AST 20 03/23/2021   NA 140 03/06/2021   K 3.8 03/05/2021   CL 107 03/20/2021   CREATININE 1.25 (H) 03/09/2021   BUN 16 03/24/2021   CO2 28 03/25/2021   TSH 2.558 03/22/2021   HGBA1C 5.7 (H) 03/16/2021      Assessment / Plan:   -Very pleasant 85 year old gentleman presenting with unstable angina pectoris is found to have severe three-vessel coronary artery disease and preserved LV function.  Coronary bypass grafting is his best option for revascularization.  The procedure and expected recovery were discussed with Mr. Whitehall and his family and their questions were answered.  They would like for [place] to proceed with plans for coronary bypass grafting by Dr. Roxan Hockey on Thursday, 03/05/2021.  We will continue the work-up with a carotid duplex scan, lower extremity arterial duplex, pulmonary function testing, and echocardiogram.    --Hypertension-blood pressure was near 180 on admission.  Control is better on  carvedilol.  He also has as needed hydralazine and labetalol ordered.  -History of hypothyroidism-levothyroxine has been resumed  -Dyslipidemia-has reported allergy to atorvastatin with "chest pain" being listed as the primary symptom.  I did not discuss this with him during the interview but we will explore this further and refer to the lipid management clinic if needed.  I  spent 30 minutes counseling the patient face to face.   Antony Odea, PA-C  03/06/2021 12:11 PM  Patient seen and examined, agree with above 89 yo man with multiple CRf who presents with an unstable anginal syndrome. R/o fo rMI Cath shows left main and 3 vessel CAD CABG indicated for survival benefit and relief of symptoms.  I discussed the general nature of the procedure, including the need for general anesthesia, the incisions to be used, the  use of cardiopulmonary bypass and the use of drainage tubes postoperatively with Mr. Arellanes.  We discussed the expected hospital stay, overall recovery and short and long term outcomes. I informed him of the indications, risks, benefits and alternatives.  He understands the risks include, but are not limited to death, stroke, MI, DVT/PE, bleeding, possible need for transfusion, infections, cardiac arrhythmias, as well as other organ system dysfunction including respiratory, renal, or GI complications.  Increased risk for complications due to advanced age and stage IIIb CKD.  He accepts the risks and agrees to proceed.   Plan CABG Thursday 8/25  Revonda Standard. Roxan Hockey, MD [facility] [phone]

## 2021-03-27 NOTE — H&P (View-Only) (Signed)
[address]       [place] [address]             [phone]        Loic Snowball [place] Medical Record [id number] Date of Birth: [birth year]  Referring: Jettie Booze, MD Primary Care: Raina Mina., MD Primary Cardiologist: Dr. Shirlee More  Chief Complaint:  Chest pain    History of Present Illness:     Mr. Taishaun Gargan is a very pleasant 85 year old male with past history significant for hypertension, dyslipidemia, stage III chronic kidney disease.  He has been having some exertional chest discomfort and tightness for about a month.  He had a more intense episode of chest pain all the way across his chest and radiating down both arms this past Saturday, 03/24/2021.  He contacted his primary care physician was advised to proceed to the [facility].  He was evaluated there with an EKG that showed sinus rhythm and no ischemic changes.  Chest x-ray was unremarkable.  High-sensitivity troponin level was also normal.  The BNP was 90.  He was hypertensive with a systolic blood pressure in the 180s.  Transfer to [facility] for further evaluation was recommended.  He remained stable following transfer.  He was admitted by the cardiology service.  Left heart catheterization was recommended and carried out earlier today.  This demonstrates three-vessel coronary artery disease with preserved LV function.  There is a 70% proximal RCA stenosis followed by high-grade stenoses in the distal RCA.  There was a 70% left main coronary artery stenosis followed by 75% mid LAD stenosis and 75% proximal circumflex stenosis.  CT surgery has been asked to evaluate Mr. Ker for consideration of operative revascularization.  Currently, Mr. Cadiente is resting in bed with his family at the bedside.  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  He reports he has been healthy overall.  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  He has never had any type of surgery.  He does report having some superficial varicosities in his right medial thigh and in his left lower leg.      Current Activity/ Functional Status: Patient is independent with mobility/ambulation, transfers, ADL's, IADL's.   Zubrod Score: At the time of surgery this patient's most appropriate activity status/level should be described as: '[]'$     0    Normal activity, no symptoms '[x]'$     1    Restricted in physical strenuous activity but ambulatory, able to do out light work '[]'$     2    Ambulatory and capable of self care, unable to do work activities, up and about                 more than 50%  Of the time                            '[]'$     3    Only limited self care, in bed greater than 50% of waking hours '[]'$     4    Completely disabled, no self care, confined to bed or chair '[]'$     5    Moribund  Past Medical History:  Diagnosis Date   Anemia of unknown etiology 06/26/2020  Formatting of this note might be different from the original. Likely of CKD   Atherosclerosis of both carotid arteries 09/27/2016   Formatting of this note might be different from the original. Noted. Mild Not obstructive.   Balance problem 10/11/2015   Benign prostatic hyperplasia without urinary obstruction 01/15/2016   Cardiac arrhythmia 01/15/2016   Carpal tunnel syndrome 01/15/2016   Degenerative cervical disc 09/25/2016   Diverticulosis    ED (erectile dysfunction) of organic origin 01/15/2016   Essential (primary) hypertension 01/15/2016   Gastroduodenitis    GERD (gastroesophageal reflux disease)    History of bleeding peptic ulcer 08/01/2020   Formatting of this note might be different from the original. Lyndel Safe around 2020.   Hypogonadism in male 01/15/2016   Hypothyroidism, unspecified 01/15/2016   Internal hemorrhoids    Lumbar radiculopathy, chronic 02/21/2016    Neuropathy    Pure hypercholesterolemia, unspecified 01/15/2016   Stage 3b chronic kidney disease ([place]) 06/26/2020   Unilateral inguinal hernia, without obstruction or gangrene, not specified as recurrent 01/15/2016   Vitamin B12 deficiency     Past Surgical History:  Procedure Laterality Date   COLONOSCOPY  10/10/2014   Colonic post status polypectomy. Mild predominantly sigmoid diverticulosis. Otherwise normal colonoscopt to TI.   ESOPHAGOGASTRODUODENOSCOPY  10/20/2008   Irregular Zline suggestive of GERD. Otherwise normal EGD.   SHOULDER SURGERY Right 06/25/2019   SKIN SURGERY     Skin cancer removed from Right side of face   UPPER GASTROINTESTINAL ENDOSCOPY     10 yrs ago    Social History   Tobacco Use  Smoking Status Former  Smokeless Tobacco Never    Social History   Substance and Sexual Activity  Alcohol Use Not Currently     Allergies  Allergen Reactions   Atorvastatin Other (See Comments)    Chest pain Chest pain    Bactrim [Sulfamethoxazole-Trimethoprim]    Prednisone Other (See Comments)    Anxiety   Sulfa Antibiotics     Current Facility-Administered Medications  Medication Dose Route Frequency Provider Last Rate Last Admin   0.9 %  sodium chloride infusion   Intravenous Continuous Larae Grooms S, MD       0.9 %  sodium chloride infusion  250 mL Intravenous PRN Jettie Booze, MD       acetaminophen (TYLENOL) tablet 650 mg  650 mg Oral Q4H PRN Jettie Booze, MD   650 mg at 03/28/2021 2010   acetaminophen (TYLENOL) tablet 650 mg  650 mg Oral Q4H PRN Jettie Booze, MD       aspirin EC tablet 81 mg  81 mg Oral Daily Jettie Booze, MD   81 mg at 03/09/2021 2010   carvedilol (COREG) tablet 3.125 mg  3.125 mg Oral BID WC Jettie Booze, MD   3.125 mg at 03/25/2021 1129   heparin injection 5,000 Units  5,000 Units Subcutaneous Q8H Jettie Booze, MD   5,000 Units at 03/31/2021 [id number]   hydrALAZINE (APRESOLINE) injection 10  mg  10 mg Intravenous Q20 Min PRN Jettie Booze, MD       labetalol (NORMODYNE) injection 10 mg  10 mg Intravenous Q10 min PRN Jettie Booze, MD       levothyroxine (SYNTHROID) tablet 75 mcg  75 mcg Oral QAC breakfast Jettie Booze, MD   75 mcg at 03/05/2021 0618   nitroGLYCERIN (NITROSTAT) SL tablet 0.4 mg  0.4 mg Sublingual Q5 Min x 3 PRN Jettie Booze, MD  ondansetron (ZOFRAN) injection 4 mg  4 mg Intravenous Q6H PRN Jettie Booze, MD       pantoprazole (PROTONIX) EC tablet 80 mg  80 mg Oral QAC breakfast Jettie Booze, MD   80 mg at 04/03/2021 0617   sodium chloride flush (NS) 0.9 % injection 3 mL  3 mL Intravenous Q12H Jettie Booze, MD   3 mL at 03/12/2021 1000   sodium chloride flush (NS) 0.9 % injection 3 mL  3 mL Intravenous Q12H Jettie Booze, MD       sodium chloride flush (NS) 0.9 % injection 3 mL  3 mL Intravenous PRN Jettie Booze, MD       vitamin B-12 (CYANOCOBALAMIN) tablet 1,000 mcg  1,000 mcg Oral Daily Larae Grooms S, MD   1,000 mcg at 03/06/2021 1129    Medications Prior to Admission  Medication Sig Dispense Refill Last Dose   atenolol (TENORMIN) 50 MG tablet Take 50 mg by mouth daily.      cyanocobalamin 1000 MCG tablet Take 1,000 mcg by mouth daily.      esomeprazole (NEXIUM) 40 MG packet Take 40 mg by mouth daily before breakfast. 30 each 11    isosorbide mononitrate (IMDUR) 30 MG 24 hr tablet Take 1 tablet (30 mg total) by mouth daily. 90 tablet 3    levothyroxine (SYNTHROID) 75 MCG tablet Take 75 mcg by mouth daily before breakfast.      nitroGLYCERIN (NITROSTAT) 0.4 MG SL tablet Place 1 tablet (0.4 mg total) under the tongue every 5 (five) minutes as needed for chest pain. 25 tablet 3     Family History  Problem Relation Age of Onset   Colon cancer Neg Hx    Colon polyps Neg Hx    Esophageal cancer Neg Hx    Rectal cancer Neg Hx    Stomach cancer Neg Hx      Review of Systems:   ROS      Cardiac Review of Systems: Y or  [    ]= no  Chest Pain [   x ]  Resting SOB [   ] Exertional SOB  [ x ]  Orthopnea [  ]   Pedal Edema [   ]    Palpitations [  ] Syncope  [  ]   Presyncope [   ]  General Review of Systems: [Y] = yes [  ]=no Constitional: recent weight change [  ]; anorexia [  ]; fatigue [  ]; nausea [  ]; night sweats [  ]; fever [  ]; or chills [  ]                                                               Dental: Last Dentist visit: Recent..  Plate dentures, partial plate on the bottom.  Says he needs a few fillings but has put this off.  Currently having no dental issues.  Eye : blurred vision [  ]; diplopia [   ]; vision changes [  ];  Amaurosis fugax[  ]; Resp: cough [  ];  wheezing[  ];  hemoptysis[  ]; shortness of breath[  ]; paroxysmal nocturnal dyspnea[  ]; dyspnea on exertion[  ]; or orthopnea[  ];  GI:  gallstones[  ], vomiting[  ];  dysphagia[  ]; melena[  ];  hematochezia [  ]; heartburn[  ];   Hx of  Colonoscopy[  ]; GU: kidney stones [  ]; hematuria[  ];   dysuria [  ];  nocturia[  ];  history of     obstruction [  ]; urinary frequency [  ]             Skin: rash, swelling[  ];, hair loss[  ];  peripheral edema[  ];  or itching[  ]; Musculosketetal: myalgias[  ];  joint swelling[  ];  joint erythema[  ];  joint pain[  ];  back pain[  ];  Heme/Lymph: bruising[  ];  bleeding[  ];  anemia[  ];  Neuro: TIA[  ];  headaches[  ];  stroke[  ];  vertigo[  ];  seizures[  ];   paresthesias[  ];  difficulty walking[  ];  Psych:depression[  ]; anxiety[  ];  Endocrine: diabetes[  ];  thyroid dysfunction[  ];                 Physical Exam: BP (!) 144/70 (BP Location: Left Wrist)   Pulse (!) 57   Temp 97.8 F (36.6 C) (Oral)   Resp 16   Ht 5' 10.5" (1.791 m)   Wt 79.7 kg   SpO2 97%   BMI 24.84 kg/m    General appearance: alert, cooperative, and no distress Head: Normocephalic, without obvious abnormality, atraumatic Neck: no adenopathy, no carotid bruit, no  JVD, supple, symmetrical, trachea midline, and thyroid not enlarged, symmetric, no tenderness/mass/nodules Lymph nodes: No cervical or clavicular adenopathy Resp: clear to auscultation bilaterally Cardio: regular rate and rhythm, S1, S2 normal, no murmur, click, rub or gallop GI: soft, non-tender; bowel sounds normal; no masses,  no organomegaly Extremities: All are well perfused with palpable pulses throughout.  He has a superficial varicosity in the region of the saphenous vein in his right thigh and also some varicosities in his left lower leg near the saphenous vein. Neurologic: Grossly normal  Diagnostic Studies & Laboratory data:  LEFT HEART CATH AND CORONARY ANGIOGRAPHY   Conclusion      Ost RCA to Prox RCA lesion is 70% stenosed.   Dist RCA-1 lesion is 90% stenosed.   Dist RCA-2 lesion is 75% stenosed.   Prox Cx lesion is 75% stenosed.   1st Mrg lesion is 80% stenosed.   Ost LM to Mid LM lesion is 70% stenosed.   Mid LAD lesion is 75% stenosed.   Prox LAD lesion is 50% stenosed.   The left ventricular systolic function is normal.   LV end diastolic pressure is normal.   The left ventricular ejection fraction is 55-65% by visual estimate.   There is no aortic valve stenosis.   Severe multivessel CAD including ostial LAD disease that causes significant pressure dampening.    Plan for cardiac surgery consult.    Indications  Unstable angina (HCC) [I20.0 (ICD-10-CM)]   Procedural Details  Technical Details The risks, benefits, and details of the procedure were explained to the patient.  The patient verbalized understanding and wanted to proceed.  Informed written consent was obtained.  PROCEDURE TECHNIQUE:  After Xylocaine anesthesia a 24F slender sheath was placed in the right radial artery with a single anterior needle wall stick.   IV Heparin was given.  Left ventriculography was done using a JR4 catheter.  Right coronary angiography was done using a Judkins  R4 guide  catheter.  Left coronary angiography was done using a Judkins L3.5 guide catheter.   A TR band was used for hemostasis.  Contrast: 70 cc   Estimated blood loss <50 mL.   During this procedure medications were administered to achieve and maintain moderate conscious sedation while the patient's heart rate, blood pressure, and oxygen saturation were continuously monitored and I was present face-to-face 100% of this time.   Medications (Filter: Administrations occurring from 1024 to 1104 on 03/06/2021)  Heparin (Porcine) in NaCl 1000-0.9 UT/500ML-% SOLN (mL) Total volume:  1,000 mL  Date/Time Rate/Dose/Volume Action   03/19/2021 1031 500 mL Given   1031 500 mL Given    fentaNYL (SUBLIMAZE) injection (mcg) Total dose:  25 mcg  Date/Time Rate/Dose/Volume Action   03/10/2021 1034 25 mcg Given    midazolam (VERSED) injection (mg) Total dose:  1 mg  Date/Time Rate/Dose/Volume Action   03/20/2021 1034 1 mg Given    lidocaine (PF) (XYLOCAINE) 1 % injection (mL) Total volume:  2 mL  Date/Time Rate/Dose/Volume Action   04/01/2021 1036 2 mL Given    Radial Cocktail/Verapamil only (mL) Total volume:  10 mL  Date/Time Rate/Dose/Volume Action   03/24/2021 1040 10 mL Given    heparin sodium (porcine) injection (Units) Total dose:  4,000 Units  Date/Time Rate/Dose/Volume Action   04/04/2021 1043 4,000 Units Given    iohexol (OMNIPAQUE) 350 MG/ML injection (mL) Total volume:  70 mL  Date/Time Rate/Dose/Volume Action   03/09/2021 1100 70 mL Given    fentaNYL (SUBLIMAZE) injection (mcg) Total dose:  25 mcg  Date/Time Rate/Dose/Volume Action   03/17/2021 1047 25 mcg Given    acetaminophen (TYLENOL) tablet 650 mg (mg) Total dose:  Cannot be calculated* Dosing weight:  81.5  *Administration dose not documented Date/Time Rate/Dose/Volume Action   03/13/2021 1024 *Not included in total MAR Hold    aspirin EC tablet 81 mg (mg) Total dose:  Cannot be calculated* Dosing weight:   81.5  *Administration dose not documented Date/Time Rate/Dose/Volume Action   03/25/2021 1024 *Not included in total MAR Hold    carvedilol (COREG) tablet 3.125 mg (mg) Total dose:  Cannot be calculated* Dosing weight:  81.5  *Administration dose not documented Date/Time Rate/Dose/Volume Action   04/01/2021 1024 *Not included in total MAR Hold    heparin injection 5,000 Units (Units) Total dose:  Cannot be calculated* Dosing weight:  81.5  *Administration dose not documented Date/Time Rate/Dose/Volume Action   03/21/2021 1024 *Not included in total MAR Hold    levothyroxine (SYNTHROID) tablet 75 mcg (mcg) Total dose:  Cannot be calculated*  *Administration dose not documented Date/Time Rate/Dose/Volume Action   03/23/2021 1024 *Not included in total MAR Hold    nitroGLYCERIN (NITROSTAT) SL tablet 0.4 mg (mg) Total dose:  Cannot be calculated* Dosing weight:  81.5  *Administration dose not documented Date/Time Rate/Dose/Volume Action   03/17/2021 1024 *Not included in total MAR Hold    pantoprazole (PROTONIX) EC tablet 80 mg (mg) Total dose:  Cannot be calculated* Dosing weight:  81.5  *Administration dose not documented Date/Time Rate/Dose/Volume Action   03/06/2021 1024 *Not included in total MAR Hold    sodium chloride flush (NS) 0.9 % injection 3 mL (mL) Total dose:  Cannot be calculated* Dosing weight:  81.5  *Administration dose not documented Date/Time Rate/Dose/Volume Action   03/07/2021 1024 *Not included in total MAR Hold    vitamin B-12 (CYANOCOBALAMIN) tablet 1,000 mcg (mcg) Total dose:  Cannot be calculated*  *Administration dose  not documented Date/Time Rate/Dose/Volume Action   04/01/2021 1024 *Not included in total MAR Hold    ondansetron (ZOFRAN) injection 4 mg (mg) Total dose:  Cannot be calculated* Dosing weight:  81.5  *Administration dose not documented Date/Time Rate/Dose/Volume Action   03/11/2021 1024 *Not included in total MAR Hold     Sedation  Time  Sedation Time Physician-1: 22 minutes 47 seconds   Radiation/Fluoro  Fluoro time: 4.5 (min) DAP: 13037 (mGycm2) Cumulative Air Kerma: 223 (mGy)   Coronary Findings   Diagnostic Dominance: Right  Left Main  Ost LM to Mid LM lesion is 70% stenosed. Significant pressure dampening with catheter engagement.  Left Anterior Descending  Prox LAD lesion is 50% stenosed.  Mid LAD lesion is 75% stenosed.  Left Circumflex  Prox Cx lesion is 75% stenosed.  First Obtuse Marginal Branch  1st Mrg lesion is 80% stenosed.  Right Coronary Artery  Ost RCA to Prox RCA lesion is 70% stenosed. The lesion is moderately calcified.  Dist RCA-1 lesion is 90% stenosed.  Dist RCA-2 lesion is 75% stenosed.   Intervention   No interventions have been documented.  Wall Motion              Left Heart  Left Ventricle The left ventricular size is normal. The left ventricular systolic function is normal. LV end diastolic pressure is normal. The left ventricular ejection fraction is 55-65% by visual estimate. No regional wall motion abnormalities.  Aortic Valve There is no aortic valve stenosis.   Coronary Diagrams   Diagnostic Dominance: Right          Recent Radiology Findings:    DG Chest 2 View  Result Date: 03/25/2021 CLINICAL DATA:  Chest pain and dyspnea EXAM: CHEST - 2 VIEW COMPARISON:  12/25/2020 chest radiograph. FINDINGS: Stable cardiomediastinal silhouette with normal heart size. No pneumothorax. No pleural effusion. Lungs appear clear, with no acute consolidative airspace disease and no pulmonary edema. IMPRESSION: No active cardiopulmonary disease. Electronically Signed   By: Ilona Sorrel M.D.   On: 04/04/2021 12:12     I have independently reviewed the above radiologic studies and discussed with the patient   Recent Lab Findings: Lab Results  Component Value Date   WBC 5.6 03/22/2021   HGB 12.3 (L) 03/06/2021   HCT 35.4 (L) 03/21/2021   PLT 169 03/14/2021    GLUCOSE 93 03/07/2021   CHOL 229 (H) 03/08/2021   TRIG 304 (H) 03/18/2021   HDL 26 (L) 03/17/2021   LDLCALC 142 (H) 03/20/2021   ALT 13 03/06/2021   AST 20 04/01/2021   NA 140 03/14/2021   K 3.8 03/25/2021   CL 107 03/28/2021   CREATININE 1.25 (H) 03/13/2021   BUN 16 03/15/2021   CO2 28 03/09/2021   TSH 2.558 03/09/2021   HGBA1C 5.7 (H) 03/09/2021      Assessment / Plan:   -Very pleasant 85 year old gentleman presenting with unstable angina pectoris is found to have severe three-vessel coronary artery disease and preserved LV function.  Coronary bypass grafting is his best option for revascularization.  The procedure and expected recovery were discussed with Mr. Hiland and his family and their questions were answered.  They would like for [place] to proceed with plans for coronary bypass grafting by Dr. Roxan Hockey on Thursday, 03/24/2021.  We will continue the work-up with a carotid duplex scan, lower extremity arterial duplex, pulmonary function testing, and echocardiogram.    --Hypertension-blood pressure was near 180 on admission.  Control is better on  carvedilol.  He also has as needed hydralazine and labetalol ordered.  -History of hypothyroidism-levothyroxine has been resumed  -Dyslipidemia-has reported allergy to atorvastatin with "chest pain" being listed as the primary symptom.  I did not discuss this with him during the interview but we will explore this further and refer to the lipid management clinic if needed.  I  spent 30 minutes counseling the patient face to face.   Antony Odea, PA-C  03/12/2021 12:11 PM  Patient seen and examined, agree with above 71 yo man with multiple CRf who presents with an unstable anginal syndrome. R/o fo rMI Cath shows left main and 3 vessel CAD CABG indicated for survival benefit and relief of symptoms.  I discussed the general nature of the procedure, including the need for general anesthesia, the incisions to be used, the  use of cardiopulmonary bypass and the use of drainage tubes postoperatively with Mr. Chickering.  We discussed the expected hospital stay, overall recovery and short and long term outcomes. I informed him of the indications, risks, benefits and alternatives.  He understands the risks include, but are not limited to death, stroke, MI, DVT/PE, bleeding, possible need for transfusion, infections, cardiac arrhythmias, as well as other organ system dysfunction including respiratory, renal, or GI complications.  Increased risk for complications due to advanced age and stage IIIb CKD.  He accepts the risks and agrees to proceed.   Plan CABG Thursday 8/25  Revonda Standard. Roxan Hockey, MD [facility] [phone]

## 2021-03-27 NOTE — Progress Notes (Signed)
[id number] Gave pt [facility] booklet, care guide, and staying in the tube handout. Discussed sternal precautions and getting up and down without use of arms. Discussed importance of IS and mobility after surgery. Gave IS and pt demonstrated 1750-2000 ml correctly. Wrote down how to view pre op video. Pt stated wife available to assist in care after discharge. Will follow up after surgery. Graylon Good RN BSN 03/07/2021 2:38 PM

## 2021-03-27 NOTE — TOC Initial Note (Signed)
Transition of Care [facility]) - Initial/Assessment Note    Patient Details  Name: Steven Valenzuela MRN: [id number] Date of Birth: October 09, 1935  [facility] of [facility]) CM/SW Contact:    Verdell Carmine, RN Phone Number: 03/25/2021, 2:27 PM  Clinical Narrative:                   85 year old gentleman, in with chest pain, cardiac catheterization performed resulting in  finding of 3V disease TCTS was consulted and has seen the patient. He has agreed with CABG. Undetermined timeframe, Will llikely need Home Health or SNF post. CM will follow for needs and transitions and involve CSW if needed.  Expected Discharge Plan: [place] Barriers to Discharge: Continued Medical Work up   Patient Goals and CMS Choice        Expected Discharge Plan and Services Expected Discharge Plan: [place]   Discharge Planning Services: CM Consult   Living arrangements for the past 2 months: Single Family Home                                      Prior Living Arrangements/Services Living arrangements for the past 2 months: Single Family Home Lives with:: Spouse Patient language and need for interpreter reviewed:: Yes        Need for Family Participation in Patient Care: Yes (Comment) Care giver support system in place?: Yes (comment)   Criminal Activity/Legal Involvement Pertinent to Current Situation/Hospitalization: No - Comment as needed  Activities of Daily Living Home Assistive Devices/Equipment: None ADL Screening (condition at time of admission) Patient's cognitive ability adequate to safely complete daily activities?: Yes Is the patient deaf or have difficulty hearing?: No Does the patient have difficulty seeing, even when wearing glasses/contacts?: No Does the patient have difficulty concentrating, remembering, or making decisions?: No Patient able to express need for assistance with ADLs?: Yes Does the patient have difficulty dressing or  bathing?: No Independently performs ADLs?: Yes (appropriate for developmental age) Does the patient have difficulty walking or climbing stairs?: No Weakness of Legs: None Weakness of Arms/Hands: None  Permission Sought/Granted                  Emotional Assessment   Attitude/Demeanor/Rapport: Gracious   Orientation: : Oriented to Self, Oriented to Place, Oriented to  Time, Oriented to Situation Alcohol / Substance Use: Not Applicable Psych Involvement: No (comment)  Admission diagnosis:  Chest pain of uncertain etiology 123456 Chest pain, unspecified type [R07.9] Unstable angina (HCC) [I20.0] Patient Active Problem List   Diagnosis Date Noted   Chest pain of uncertain etiology [id number]   Unstable angina ([place]) 04/03/2021   Bradycardia    Vitamin B12 deficiency    Thyroid disease    Internal hemorrhoids    Gastroduodenitis    Diverticulosis    Anemia    Hypercholesteremia    History of bleeding peptic ulcer 08/01/2020   Anemia of unknown etiology 06/26/2020   Stage 3b chronic kidney disease ([place]) 06/26/2020   Atherosclerosis of both carotid arteries 09/27/2016   Degenerative cervical disc 09/25/2016   Lumbar radiculopathy, chronic 02/21/2016   Benign prostatic hyperplasia without urinary obstruction 01/15/2016   Cardiac arrhythmia 01/15/2016   Carpal tunnel syndrome 01/15/2016   Deficiency of other specified B group vitamins 01/15/2016   ED (erectile dysfunction) of organic origin 01/15/2016   Essential (primary) hypertension 01/15/2016  GERD (gastroesophageal reflux disease) 01/15/2016   Hypertrophy of breast 01/15/2016   Hypogonadism in male 01/15/2016   Hypothyroidism, unspecified 01/15/2016   Neuropathy 01/15/2016   Pure hypercholesterolemia 01/15/2016   Unilateral inguinal hernia, without obstruction or gangrene, not specified as recurrent 01/15/2016   Balance problem 10/11/2015   Numbness in feet 10/11/2015   Pain in both feet 10/11/2015   PCP:   Raina Mina., MD Pharmacy:   [facility], [place] [address] Phone: [phone] Fax: [phone]     Social Determinants of Health (SDOH) Interventions    Readmission Risk Interventions No flowsheet data found.

## 2021-03-27 NOTE — Progress Notes (Signed)
Progress Note  Patient Name: Steven Valenzuela Date of Encounter: 03/31/2021  [facility] Cardiologist:  Bettina Gavia  Subjective   No chest pain this am  Inpatient Medications    Scheduled Meds:  aspirin EC  81 mg Oral Daily   carvedilol  3.125 mg Oral BID WC   heparin  5,000 Units Subcutaneous Q8H   levothyroxine  75 mcg Oral QAC breakfast   pantoprazole  80 mg Oral QAC breakfast   sodium chloride flush  3 mL Intravenous Q12H   cyanocobalamin  1,000 mcg Oral Daily   Continuous Infusions:  sodium chloride     sodium chloride 1 mL/kg/hr (03/31/2021 0501)   PRN Meds: sodium chloride, acetaminophen, nitroGLYCERIN, ondansetron (ZOFRAN) IV, sodium chloride flush   Vital Signs    Vitals:   03/12/2021 0000 03/28/2021 0500 03/08/2021 0822 04/04/2021 0824  BP:   (!) 148/74 (!) 148/74  Pulse:   61 81  Resp:   17   Temp: (!) 97.2 F (36.2 C) (!) 97.3 F (36.3 C)  97.8 F (36.6 C)  TempSrc: Oral Oral Oral   SpO2:   96% 96%  Weight:  79.7 kg    Height:        Intake/Output Summary (Last 24 hours) at 04/03/2021 1023 Last data filed at 04/03/2021 2223 Gross per 24 hour  Intake --  Output 350 ml  Net -350 ml   Last 3 Weights 03/22/2021 03/25/2021 03/19/2021  Weight (lbs) 175 lb 9.6 oz 179 lb 10.8 oz 180 lb  Weight (kg) 79.652 kg 81.5 kg 81.647 kg      Telemetry    sinus - Personally Reviewed  ECG    NSR, no ischemic changes- Personally Reviewed  Physical Exam   GEN: No acute distress.   Neck: No JVD Cardiac: RRR, no murmurs, rubs, or gallops.  Respiratory: Clear to auscultation bilaterally. GI: Soft, nontender, non-distended  MS: No edema; No deformity. Neuro:  Nonfocal  Psych: Normal affect   Labs    High Sensitivity Troponin:   Recent Labs  Lab 03/05/2021 1132 03/18/2021 1353  TROPONINIHS 8 10      Chemistry Recent Labs  Lab 03/09/2021 1132 03/10/2021 1941 03/31/2021 0211  NA 138  --  140  K 4.5  --  3.8  CL 102  --  107  CO2 26  --  28  GLUCOSE 93  --  93   BUN 18  --  16  CREATININE 1.20 1.15 1.25*  CALCIUM 9.1  --  8.9  PROT 7.0  --   --   ALBUMIN 3.8  --   --   AST 20  --   --   ALT 13  --   --   ALKPHOS 57  --   --   BILITOT 0.5  --   --   GFRNONAA >60 >60 57*  ANIONGAP 10  --  5     Hematology Recent Labs  Lab 03/15/2021 1132 03/05/2021 1941 03/10/2021 0211  WBC 5.7 6.3 5.6  RBC 4.18* 4.17* 3.87*  HGB 13.1 12.9* 12.3*  HCT 38.4* 38.4* 35.4*  MCV 91.9 92.1 91.5  MCH 31.3 30.9 31.8  MCHC 34.1 33.6 34.7  RDW 13.3 13.3 13.2  PLT 166 194 169    BNP Recent Labs  Lab 03/07/2021 1132  BNP 90.0     DDimer No results for input(s): DDIMER in the last 168 hours.   Radiology    DG Chest 2 View  Result Date: 03/25/2021 CLINICAL  DATA:  Chest pain and dyspnea EXAM: CHEST - 2 VIEW COMPARISON:  12/25/2020 chest radiograph. FINDINGS: Stable cardiomediastinal silhouette with normal heart size. No pneumothorax. No pleural effusion. Lungs appear clear, with no acute consolidative airspace disease and no pulmonary edema. IMPRESSION: No active cardiopulmonary disease. Electronically Signed   By: Ilona Sorrel M.D.   On: 03/07/2021 12:12    Cardiac Studies     Patient Profile     85 y.o. male with history of HTN, HLD, hypothyroidism and GERD admitted with chest pain concerning for unstable angina. Troponin negative. No EKG changes  Assessment & Plan    Unstable angina: Pt is on the schedule for cardiac cath today. No chest pain this am. Labs reviewed.    For questions or updates, please contact [place] Please consult [email or web address] for contact info under        Signed, Lauree Chandler, MD  03/16/2021, 10:23 AM

## 2021-03-27 NOTE — Interval H&P Note (Signed)
Cath Lab Visit (complete for each Cath Lab visit)  Clinical Evaluation Leading to the Procedure:   ACS: Yes.    Non-ACS:    Anginal Classification: CCS IV  Anti-ischemic medical therapy: Minimal Therapy (1 class of medications)  Non-Invasive Test Results: No non-invasive testing performed  Prior CABG: No previous CABG      History and Physical Interval Note:  03/16/2021 10:24 AM  Steven Valenzuela  has presented today for surgery, with the diagnosis of unstable angina.  The various methods of treatment have been discussed with the patient and family. After consideration of risks, benefits and other options for treatment, the patient has consented to  Procedure(s): LEFT HEART CATH AND CORONARY ANGIOGRAPHY (N/A) as a surgical intervention.  The patient's history has been reviewed, patient examined, no change in status, stable for surgery.  I have reviewed the patient's chart and labs.  Questions were answered to the patient's satisfaction.     Larae Grooms

## 2021-03-28 ENCOUNTER — Inpatient Hospital Stay (HOSPITAL_COMMUNITY): Payer: PPO

## 2021-03-28 ENCOUNTER — Inpatient Hospital Stay (HOSPITAL_COMMUNITY)
Admit: 2021-03-28 | Discharge: 2021-03-28 | Disposition: A | Discharge status: Home / Self Care | Payer: PPO | Attending: Thoracic Surgery (Cardiothoracic Vascular Surgery) | Admitting: Thoracic Surgery (Cardiothoracic Vascular Surgery)

## 2021-03-28 ENCOUNTER — Encounter (HOSPITAL_COMMUNITY): Payer: Self-pay | Admitting: Interventional Cardiology

## 2021-03-28 DIAGNOSIS — Z01810 Encounter for preprocedural cardiovascular examination: Secondary | ICD-10-CM | POA: Diagnosis not present

## 2021-03-28 DIAGNOSIS — I200 Unstable angina: Secondary | ICD-10-CM | POA: Diagnosis not present

## 2021-03-28 DIAGNOSIS — M79605 Pain in left leg: Secondary | ICD-10-CM | POA: Diagnosis not present

## 2021-03-28 LAB — URINALYSIS, ROUTINE W REFLEX MICROSCOPIC
Bilirubin Urine: NEGATIVE
Glucose, UA: NEGATIVE mg/dL
Hgb urine dipstick: NEGATIVE
Ketones, ur: NEGATIVE mg/dL
Leukocytes,Ua: NEGATIVE
Nitrite: NEGATIVE
Protein, ur: NEGATIVE mg/dL
Specific Gravity, Urine: 1.016 (ref 1.005–1.030)
pH: 6 (ref 5.0–8.0)

## 2021-03-28 LAB — PULMONARY FUNCTION TEST
FEF 25-75 Pre: 2.31 L/sec
FEF2575-%Pred-Pre: 120 %
FEV1-%Pred-Pre: 91 %
FEV1-Pre: 2.61 L
FEV1FVC-%Pred-Pre: 109 %
FEV6-%Pred-Pre: 89 %
FEV6-Pre: 3.36 L
FEV6FVC-%Pred-Pre: 107 %
FVC-%Pred-Pre: 83 %
FVC-Pre: 3.36 L
Pre FEV1/FVC ratio: 78 %
Pre FEV6/FVC Ratio: 100 %

## 2021-03-28 LAB — TYPE AND SCREEN
ABO/RH(D): B POS
Antibody Screen: NEGATIVE

## 2021-03-28 MED ORDER — METOPROLOL TARTRATE 12.5 MG HALF TABLET
12.5000 mg | ORAL_TABLET | Freq: Once | ORAL | Status: AC
  Administered 2021-03-29: 12.5 mg via ORAL
  Filled 2021-03-28: qty 1

## 2021-03-28 MED ORDER — TRANEXAMIC ACID (OHS) PUMP PRIME SOLUTION
2.0000 mg/kg | INTRAVENOUS | Status: DC
  Filled 2021-03-28: qty 1.6

## 2021-03-28 MED ORDER — CHLORHEXIDINE GLUCONATE CLOTH 2 % EX PADS
6.0000 | MEDICATED_PAD | Freq: Once | CUTANEOUS | Status: AC
  Administered 2021-03-29: 6 via TOPICAL

## 2021-03-28 MED ORDER — DEXMEDETOMIDINE HCL IN NACL 400 MCG/100ML IV SOLN
0.1000 ug/kg/h | INTRAVENOUS | Status: AC
  Administered 2021-03-29: .5 ug/kg/h via INTRAVENOUS
  Filled 2021-03-28: qty 100

## 2021-03-28 MED ORDER — TRANEXAMIC ACID 1000 MG/10ML IV SOLN
1.5000 mg/kg/h | INTRAVENOUS | Status: AC
  Administered 2021-03-29: 1.5 mg/kg/h via INTRAVENOUS
  Filled 2021-03-28: qty 25

## 2021-03-28 MED ORDER — MAGNESIUM SULFATE 50 % IJ SOLN
40.0000 meq | INTRAMUSCULAR | Status: DC
  Filled 2021-03-28: qty 9.85

## 2021-03-28 MED ORDER — CEFAZOLIN SODIUM-DEXTROSE 2-4 GM/100ML-% IV SOLN
2.0000 g | INTRAVENOUS | Status: AC
  Administered 2021-03-29 (×2): 2 g via INTRAVENOUS
  Filled 2021-03-28: qty 100

## 2021-03-28 MED ORDER — CEFAZOLIN SODIUM-DEXTROSE 2-4 GM/100ML-% IV SOLN
2.0000 g | INTRAVENOUS | Status: DC
  Filled 2021-03-28: qty 100

## 2021-03-28 MED ORDER — PHENYLEPHRINE HCL-NACL 20-0.9 MG/250ML-% IV SOLN
30.0000 ug/min | INTRAVENOUS | Status: AC
  Administered 2021-03-29: 25 ug/min via INTRAVENOUS
  Filled 2021-03-28: qty 250

## 2021-03-28 MED ORDER — CHLORHEXIDINE GLUCONATE 0.12 % MT SOLN
15.0000 mL | Freq: Once | OROMUCOSAL | Status: AC
  Administered 2021-03-29: 15 mL via OROMUCOSAL
  Filled 2021-03-28: qty 15

## 2021-03-28 MED ORDER — ROSUVASTATIN CALCIUM 5 MG PO TABS
10.0000 mg | ORAL_TABLET | Freq: Every day | ORAL | Status: DC
  Administered 2021-03-28 – 2021-04-02 (×5): 10 mg via ORAL
  Filled 2021-03-28 (×5): qty 2

## 2021-03-28 MED ORDER — CHLORHEXIDINE GLUCONATE CLOTH 2 % EX PADS
6.0000 | MEDICATED_PAD | Freq: Once | CUTANEOUS | Status: AC
  Administered 2021-03-28: 6 via TOPICAL

## 2021-03-28 MED ORDER — DIAZEPAM 2 MG PO TABS
2.0000 mg | ORAL_TABLET | Freq: Once | ORAL | Status: AC
  Administered 2021-03-29: 2 mg via ORAL
  Filled 2021-03-28: qty 1

## 2021-03-28 MED ORDER — INSULIN REGULAR(HUMAN) IN NACL 100-0.9 UT/100ML-% IV SOLN
INTRAVENOUS | Status: AC
  Administered 2021-03-29: .9 [IU]/h via INTRAVENOUS
  Filled 2021-03-28: qty 100

## 2021-03-28 MED ORDER — EPINEPHRINE HCL 5 MG/250ML IV SOLN IN NS
0.0000 ug/min | INTRAVENOUS | Status: DC
Start: 2021-03-29 — End: 2021-03-29
  Filled 2021-03-28: qty 250

## 2021-03-28 MED ORDER — MILRINONE LACTATE IN DEXTROSE 20-5 MG/100ML-% IV SOLN
0.3000 ug/kg/min | INTRAVENOUS | Status: DC
  Filled 2021-03-28: qty 100

## 2021-03-28 MED ORDER — PLASMA-LYTE A IV SOLN
INTRAVENOUS | Status: DC
  Filled 2021-03-28: qty 5

## 2021-03-28 MED ORDER — TRANEXAMIC ACID (OHS) BOLUS VIA INFUSION
15.0000 mg/kg | INTRAVENOUS | Status: AC
  Administered 2021-03-29: 1200 mg via INTRAVENOUS
  Filled 2021-03-28: qty 1200

## 2021-03-28 MED ORDER — VANCOMYCIN HCL 1250 MG/250ML IV SOLN
1250.0000 mg | INTRAVENOUS | Status: AC
  Administered 2021-03-29: 1250 mg via INTRAVENOUS
  Filled 2021-03-28: qty 250

## 2021-03-28 MED ORDER — NITROGLYCERIN IN D5W 200-5 MCG/ML-% IV SOLN
2.0000 ug/min | INTRAVENOUS | Status: AC
  Administered 2021-03-29: 5 ug/min via INTRAVENOUS
  Filled 2021-03-28: qty 250

## 2021-03-28 MED ORDER — POTASSIUM CHLORIDE 2 MEQ/ML IV SOLN
80.0000 meq | INTRAVENOUS | Status: DC
  Filled 2021-03-28: qty 40

## 2021-03-28 MED ORDER — BISACODYL 5 MG PO TBEC
5.0000 mg | DELAYED_RELEASE_TABLET | Freq: Once | ORAL | Status: AC
  Administered 2021-03-28: 5 mg via ORAL
  Filled 2021-03-28: qty 1

## 2021-03-28 MED ORDER — NOREPINEPHRINE 4 MG/250ML-% IV SOLN
0.0000 ug/min | INTRAVENOUS | Status: DC
  Filled 2021-03-28: qty 250

## 2021-03-28 MED ORDER — SODIUM CHLORIDE 0.9 % IV SOLN
INTRAVENOUS | Status: DC
  Filled 2021-03-28: qty 30

## 2021-03-28 NOTE — Progress Notes (Signed)
Pre-CABG exams and LLE venous duplex have been completed.  Results can be found under chart review under CV PROC. 03/28/2021 5:07 PM Nyshawn Gowdy RVT, RDMS

## 2021-03-28 NOTE — Progress Notes (Signed)
Progress Note  Patient Name: Lavin Petteway Date of Encounter: 03/28/2021  [facility] Cardiologist: None   Subjective   Minimal chest discomfort reported.  Overall feeling well.  Surgical notes reviewed with plans for CABG tomorrow.  No shortness of breath.  Inpatient Medications    Scheduled Meds:  aspirin EC  81 mg Oral Daily   carvedilol  3.125 mg Oral BID WC   [START ON 03/23/2021] epinephrine  0-10 mcg/min Intravenous To OR   heparin  5,000 Units Subcutaneous Q8H   [START ON 03/05/2021] heparin-papaverine-plasmalyte irrigation   Irrigation To OR   [START ON 03/05/2021] insulin   Intravenous To OR   levothyroxine  75 mcg Oral QAC breakfast   [START ON 03/13/2021] magnesium sulfate  40 mEq Other To OR   pantoprazole  80 mg Oral QAC breakfast   [START ON 03/16/2021] phenylephrine  30-200 mcg/min Intravenous To OR   [START ON 03/28/2021] potassium chloride  80 mEq Other To OR   sodium chloride flush  3 mL Intravenous Q12H   sodium chloride flush  3 mL Intravenous Q12H   [START ON 03/12/2021] tranexamic acid  15 mg/kg Intravenous To OR   [START ON 03/28/2021] tranexamic acid  2 mg/kg Intracatheter To OR   cyanocobalamin  1,000 mcg Oral Daily   Continuous Infusions:  sodium chloride     [START ON 03/13/2021]  ceFAZolin (ANCEF) IV     [START ON 03/05/2021]  ceFAZolin (ANCEF) IV     [START ON 03/06/2021] dexmedetomidine     [START ON 03/20/2021] heparin 30,000 units/NS 1000 mL solution for CELLSAVER     [START ON 03/18/2021] milrinone     [START ON 03/23/2021] nitroGLYCERIN     [START ON 03/06/2021] norepinephrine     [START ON 04/03/2021] tranexamic acid (CYKLOKAPRON) infusion ([facility])     [START ON 03/17/2021] vancomycin     PRN Meds: sodium chloride, acetaminophen, acetaminophen, ALPRAZolam, nitroGLYCERIN, ondansetron (ZOFRAN) IV, sodium chloride flush   Vital Signs    Vitals:   03/14/2021 1337 03/10/2021 1406 04/01/2021 1951 03/28/21 0510  BP: 126/75 (!) 148/68 (!) 147/56 (!) 136/57   Pulse: (!) 56 (!) 57 65 83  Resp:   20 13  Temp:   97.6 F (36.4 C) 97.7 F (36.5 C)  TempSrc:   Oral Oral  SpO2: 95% 96% 99% 98%  Weight:    80 kg  Height:        Intake/Output Summary (Last 24 hours) at 03/28/2021 1018 Last data filed at 03/28/2021 8546 Gross per 24 hour  Intake 425.81 ml  Output 600 ml  Net -174.19 ml   Last 3 Weights 03/28/2021 03/24/2021 03/06/2021  Weight (lbs) 176 lb 5.9 oz 175 lb 9.6 oz 179 lb 10.8 oz  Weight (kg) 80 kg 79.652 kg 81.5 kg      Telemetry    Normal sinus rhythm without arrhythmia - Personally Reviewed   Physical Exam  Alert, oriented elderly male in no distress GEN: No acute distress.   Neck: No JVD Cardiac: RRR, no murmurs, rubs, or gallops.  Respiratory: Clear to auscultation bilaterally. GI: Soft, nontender, non-distended  MS: No edema; No deformity.  Right radial cath site is clear. Neuro:  Nonfocal  Psych: Normal affect   Labs    High Sensitivity Troponin:   Recent Labs  Lab 04/03/2021 1132 03/10/2021 1353  TROPONINIHS 8 10      Chemistry Recent Labs  Lab 03/23/2021 1132 03/05/2021 1941 03/11/2021 0211  NA 138  --  140  K 4.5  --  3.8  CL 102  --  107  CO2 26  --  28  GLUCOSE 93  --  93  BUN 18  --  16  CREATININE 1.20 1.15 1.25*  CALCIUM 9.1  --  8.9  PROT 7.0  --   --   ALBUMIN 3.8  --   --   AST 20  --   --   ALT 13  --   --   ALKPHOS 57  --   --   BILITOT 0.5  --   --   GFRNONAA >60 >60 57*  ANIONGAP 10  --  5     Hematology Recent Labs  Lab 03/14/2021 1132 04/01/2021 1941 03/28/2021 0211  WBC 5.7 6.3 5.6  RBC 4.18* 4.17* 3.87*  HGB 13.1 12.9* 12.3*  HCT 38.4* 38.4* 35.4*  MCV 91.9 92.1 91.5  MCH 31.3 30.9 31.8  MCHC 34.1 33.6 34.7  RDW 13.3 13.3 13.2  PLT 166 194 169    BNP Recent Labs  Lab 03/05/2021 1132  BNP 90.0     DDimer No results for input(s): DDIMER in the last 168 hours.   Radiology    DG Chest 2 View  Result Date: 03/25/2021 CLINICAL DATA:  Chest pain and dyspnea EXAM: CHEST -  2 VIEW COMPARISON:  12/25/2020 chest radiograph. FINDINGS: Stable cardiomediastinal silhouette with normal heart size. No pneumothorax. No pleural effusion. Lungs appear clear, with no acute consolidative airspace disease and no pulmonary edema. IMPRESSION: No active cardiopulmonary disease. Electronically Signed   By: Ilona Sorrel M.D.   On: 03/18/2021 12:12   CARDIAC CATHETERIZATION  Result Date: 03/10/2021   Ost RCA to Prox RCA lesion is 70% stenosed.   Dist RCA-1 lesion is 90% stenosed.   Dist RCA-2 lesion is 75% stenosed.   Prox Cx lesion is 75% stenosed.   1st Mrg lesion is 80% stenosed.   Ost LM to Mid LM lesion is 70% stenosed.   Mid LAD lesion is 75% stenosed.   Prox LAD lesion is 50% stenosed.   The left ventricular systolic function is normal.   LV end diastolic pressure is normal.   The left ventricular ejection fraction is 55-65% by visual estimate.   There is no aortic valve stenosis. Severe multivessel CAD including ostial LAD disease that causes significant pressure dampening. Plan for cardiac surgery consult.   ECHOCARDIOGRAM COMPLETE  Result Date: 03/15/2021    ECHOCARDIOGRAM REPORT   Patient Name:   YUVAAN OLANDER Date of Exam: 03/24/2021 Medical Rec #:  [id number]       Height:       70.5 in Accession #:    [id number]      Weight:       175.6 lb Date of Birth:  Jun 09, 1936       BSA:          1.985 m Patient Age:    48 years        BP:           144/70 mmHg Patient Gender: M               HR:           63 bpm. Exam Location:  Inpatient Procedure: 2D Echo Indications:    chest pain  History:        Patient has no prior history of Echocardiogram examinations.  CAD; Risk Factors:Dyslipidemia.  Sonographer:    Johny Chess RDCS Referring Phys: [id number] [place]  1. Left ventricular ejection fraction, by estimation, is 60 to 65%. The left ventricle has normal function. The left ventricle has no regional wall motion abnormalities. There is mild left  ventricular hypertrophy. Left ventricular diastolic parameters are consistent with Grade II diastolic dysfunction (pseudonormalization). Elevated left ventricular end-diastolic pressure. The E/e' is 23.  2. Right ventricular systolic function is normal. The right ventricular size is normal. There is normal pulmonary artery systolic pressure. The estimated right ventricular systolic pressure is 94.7 mmHg.  3. The mitral valve is degenerative. Mild mitral valve regurgitation. No evidence of mitral stenosis. Mild-moderate MAC.  4. The aortic valve is grossly normal. There is mild calcification of the aortic valve. Aortic valve regurgitation is not visualized. No aortic stenosis is present.  5. The inferior vena cava is normal in size with greater than 50% respiratory variability, suggesting right atrial pressure of 3 mmHg. FINDINGS  Left Ventricle: Left ventricular ejection fraction, by estimation, is 60 to 65%. The left ventricle has normal function. The left ventricle has no regional wall motion abnormalities. The left ventricular internal cavity size was normal in size. There is  mild left ventricular hypertrophy. Left ventricular diastolic parameters are consistent with Grade II diastolic dysfunction (pseudonormalization). Elevated left ventricular end-diastolic pressure. The E/e' is 84. Right Ventricle: The right ventricular size is normal. No increase in right ventricular wall thickness. Right ventricular systolic function is normal. There is normal pulmonary artery systolic pressure. The tricuspid regurgitant velocity is 2.78 m/s, and  with an assumed right atrial pressure of 3 mmHg, the estimated right ventricular systolic pressure is 65.4 mmHg. Left Atrium: Left atrial size was normal in size. Right Atrium: Right atrial size was normal in size. Pericardium: There is no evidence of pericardial effusion. Mitral Valve: The mitral valve is degenerative in appearance. Mild to moderate mitral annular calcification.  Mild mitral valve regurgitation. No evidence of mitral valve stenosis. Tricuspid Valve: The tricuspid valve is normal in structure. Tricuspid valve regurgitation is mild . No evidence of tricuspid stenosis. Aortic Valve: The aortic valve is grossly normal. There is mild calcification of the aortic valve. Aortic valve regurgitation is not visualized. No aortic stenosis is present. Pulmonic Valve: The pulmonic valve was normal in structure. Pulmonic valve regurgitation is mild. No evidence of pulmonic stenosis. Aorta: The aortic root is normal in size and structure. Venous: The inferior vena cava is normal in size with greater than 50% respiratory variability, suggesting right atrial pressure of 3 mmHg. IAS/Shunts: No atrial level shunt detected by color flow Doppler.  LEFT VENTRICLE PLAX 2D LVIDd:         4.40 cm  Diastology LVIDs:         2.80 cm  LV e' medial:    6.09 cm/s LV PW:         1.00 cm  LV E/e' medial:  22.8 LV IVS:        1.00 cm  LV e' lateral:   7.72 cm/s LVOT diam:     1.80 cm  LV E/e' lateral: 18.0 LV SV:         60 LV SV Index:   30 LVOT Area:     2.54 cm  RIGHT VENTRICLE             IVC RV S prime:     12.60 cm/s  IVC diam: 1.80 cm TAPSE (M-mode): 2.6 cm LEFT  ATRIUM             Index       RIGHT ATRIUM           Index LA diam:        4.20 cm 2.12 cm/m  RA Area:     12.20 cm LA Vol (A2C):   53.6 ml 27.00 ml/m RA Volume:   26.80 ml  13.50 ml/m LA Vol (A4C):   55.5 ml 27.95 ml/m LA Biplane Vol: 57.4 ml 28.91 ml/m  AORTIC VALVE LVOT Vmax:   97.40 cm/s LVOT Vmean:  64.900 cm/s LVOT VTI:    0.237 m  AORTA Ao Root diam: 3.60 cm Ao Asc diam:  3.00 cm MITRAL VALVE                TRICUSPID VALVE MV Area (PHT): 3.08 cm     TR Peak grad:   30.9 mmHg MV Decel Time: 246 msec     TR Vmax:        278.00 cm/s MV E velocity: 139.00 cm/s MV A velocity: 110.00 cm/s  SHUNTS MV E/A ratio:  1.26         Systemic VTI:  0.24 m                             Systemic Diam: 1.80 cm Cherlynn Kaiser MD Electronically  signed by Cherlynn Kaiser MD Signature Date/Time: 03/24/2021/5:24:23 PM    Final     Cardiac Studies   Cardiac catheterization and 2D echo results both personally reviewed.  Cardiac catheterization films reviewed.  Patient Profile     85 y.o. male with newly diagnosed severe multivessel coronary artery disease going for planned CABG 03/31/2021 per Dr. Roxan Hockey  Assessment & Plan    1.  Unstable angina: Patient found to have severe three-vessel coronary artery disease by cardiac catheterization 03/24/2021.  Surgical consultation has been obtained because of severe multivessel and left main coronary disease.  Plans for CABG tomorrow.  No recurrent angina in the hospital.  Continue aspirin and carvedilol.  Add low-dose statin.  See discussion below. 2.  Mixed hyperlipidemia: Patient with history of atorvastatin intolerance.  Per review of notes he is willing to try low-dose statin.  Will initiate rosuvastatin 10 mg daily.  We will titrate to high intensity dose as tolerated.  Patient appears ready for CABG tomorrow.  Echocardiogram shows normal LV and RV function with no significant valvular disease.  For questions or updates, please contact [place] Please consult [email or web address] for contact info under        Signed, Sherren Mocha, MD  03/28/2021, 10:18 AM

## 2021-03-29 ENCOUNTER — Inpatient Hospital Stay (HOSPITAL_COMMUNITY)
Admission: EM | Disposition: E | Payer: Self-pay | Source: Home / Self Care | Attending: Thoracic Surgery (Cardiothoracic Vascular Surgery)

## 2021-03-29 ENCOUNTER — Inpatient Hospital Stay (HOSPITAL_COMMUNITY): Payer: PPO | Admitting: Certified Registered"

## 2021-03-29 ENCOUNTER — Inpatient Hospital Stay (HOSPITAL_COMMUNITY): Payer: PPO

## 2021-03-29 ENCOUNTER — Encounter (HOSPITAL_COMMUNITY): Payer: Self-pay | Admitting: Cardiovascular Disease

## 2021-03-29 DIAGNOSIS — I25110 Atherosclerotic heart disease of native coronary artery with unstable angina pectoris: Secondary | ICD-10-CM | POA: Diagnosis not present

## 2021-03-29 DIAGNOSIS — Z951 Presence of aortocoronary bypass graft: Secondary | ICD-10-CM

## 2021-03-29 DIAGNOSIS — R911 Solitary pulmonary nodule: Secondary | ICD-10-CM | POA: Diagnosis not present

## 2021-03-29 DIAGNOSIS — I200 Unstable angina: Secondary | ICD-10-CM | POA: Diagnosis not present

## 2021-03-29 HISTORY — PX: CORONARY ARTERY BYPASS GRAFT: SHX141

## 2021-03-29 HISTORY — PX: TEE WITHOUT CARDIOVERSION: SHX5443

## 2021-03-29 LAB — CBC
HCT: 40.4 % (ref 39.0–52.0)
HCT: 29.4 % — ABNORMAL LOW (ref 39.0–52.0)
Hemoglobin: 13.7 g/dL (ref 13.0–17.0)
Hemoglobin: 9.8 g/dL — ABNORMAL LOW (ref 13.0–17.0)
MCH: 30.9 pg (ref 26.0–34.0)
MCH: 30.9 pg (ref 26.0–34.0)
MCHC: 33.9 g/dL (ref 30.0–36.0)
MCHC: 33.3 g/dL (ref 30.0–36.0)
MCV: 91 fL (ref 80.0–100.0)
MCV: 92.7 fL (ref 80.0–100.0)
Platelets: 191 10*3/uL (ref 150–400)
Platelets: 111 10*3/uL — ABNORMAL LOW (ref 150–400)
RBC: 4.44 MIL/uL (ref 4.22–5.81)
RBC: 3.17 MIL/uL — ABNORMAL LOW (ref 4.22–5.81)
RDW: 13.2 % (ref 11.5–15.5)
RDW: 13.2 % (ref 11.5–15.5)
WBC: 6 10*3/uL (ref 4.0–10.5)
WBC: 8.9 10*3/uL (ref 4.0–10.5)
nRBC: 0 % (ref 0.0–0.2)
nRBC: 0 % (ref 0.0–0.2)

## 2021-03-29 LAB — POCT I-STAT 7, (LYTES, BLD GAS, ICA,H+H)
Acid-Base Excess: 3 mmol/L — ABNORMAL HIGH (ref 0.0–2.0)
Acid-Base Excess: 3 mmol/L — ABNORMAL HIGH (ref 0.0–2.0)
Acid-Base Excess: 3 mmol/L — ABNORMAL HIGH (ref 0.0–2.0)
Bicarbonate: 26.7 mmol/L (ref 20.0–28.0)
Bicarbonate: 26.6 mmol/L (ref 20.0–28.0)
Bicarbonate: 27.3 mmol/L (ref 20.0–28.0)
Calcium, Ion: 0.97 mmol/L — ABNORMAL LOW (ref 1.15–1.40)
Calcium, Ion: 1.03 mmol/L — ABNORMAL LOW (ref 1.15–1.40)
Calcium, Ion: 1.01 mmol/L — ABNORMAL LOW (ref 1.15–1.40)
HCT: 24 % — ABNORMAL LOW (ref 39.0–52.0)
HCT: 24 % — ABNORMAL LOW (ref 39.0–52.0)
HCT: 23 % — ABNORMAL LOW (ref 39.0–52.0)
Hemoglobin: 8.2 g/dL — ABNORMAL LOW (ref 13.0–17.0)
Hemoglobin: 8.2 g/dL — ABNORMAL LOW (ref 13.0–17.0)
Hemoglobin: 7.8 g/dL — ABNORMAL LOW (ref 13.0–17.0)
O2 Saturation: 100 %
O2 Saturation: 100 %
O2 Saturation: 100 %
Potassium: 4.3 mmol/L (ref 3.5–5.1)
Potassium: 6 mmol/L — ABNORMAL HIGH (ref 3.5–5.1)
Potassium: 5.9 mmol/L — ABNORMAL HIGH (ref 3.5–5.1)
Sodium: 135 mmol/L (ref 135–145)
Sodium: 135 mmol/L (ref 135–145)
Sodium: 135 mmol/L (ref 135–145)
TCO2: 28 mmol/L (ref 22–32)
TCO2: 28 mmol/L (ref 22–32)
TCO2: 29 mmol/L (ref 22–32)
pCO2 arterial: 37.8 mmHg (ref 32.0–48.0)
pCO2 arterial: 37.5 mmHg (ref 32.0–48.0)
pCO2 arterial: 41.2 mmHg (ref 32.0–48.0)
pH, Arterial: 7.457 — ABNORMAL HIGH (ref 7.350–7.450)
pH, Arterial: 7.459 — ABNORMAL HIGH (ref 7.350–7.450)
pH, Arterial: 7.429 (ref 7.350–7.450)
pO2, Arterial: 348 mmHg — ABNORMAL HIGH (ref 83.0–108.0)
pO2, Arterial: 292 mmHg — ABNORMAL HIGH (ref 83.0–108.0)
pO2, Arterial: 317 mmHg — ABNORMAL HIGH (ref 83.0–108.0)

## 2021-03-29 LAB — PROTIME-INR
INR: 1 (ref 0.8–1.2)
INR: 1.3 — ABNORMAL HIGH (ref 0.8–1.2)
Prothrombin Time: 13 seconds (ref 11.4–15.2)
Prothrombin Time: 16.2 seconds — ABNORMAL HIGH (ref 11.4–15.2)

## 2021-03-29 LAB — POCT I-STAT, CHEM 8
BUN: 23 mg/dL (ref 8–23)
BUN: 20 mg/dL (ref 8–23)
BUN: 19 mg/dL (ref 8–23)
BUN: 19 mg/dL (ref 8–23)
BUN: 19 mg/dL (ref 8–23)
Calcium, Ion: 1.25 mmol/L (ref 1.15–1.40)
Calcium, Ion: 1.24 mmol/L (ref 1.15–1.40)
Calcium, Ion: 1.02 mmol/L — ABNORMAL LOW (ref 1.15–1.40)
Calcium, Ion: 0.98 mmol/L — ABNORMAL LOW (ref 1.15–1.40)
Calcium, Ion: 1.29 mmol/L (ref 1.15–1.40)
Chloride: 105 mmol/L (ref 98–111)
Chloride: 104 mmol/L (ref 98–111)
Chloride: 101 mmol/L (ref 98–111)
Chloride: 101 mmol/L (ref 98–111)
Chloride: 102 mmol/L (ref 98–111)
Creatinine, Ser: 1.1 mg/dL (ref 0.61–1.24)
Creatinine, Ser: 1 mg/dL (ref 0.61–1.24)
Creatinine, Ser: 0.9 mg/dL (ref 0.61–1.24)
Creatinine, Ser: 0.8 mg/dL (ref 0.61–1.24)
Creatinine, Ser: 0.9 mg/dL (ref 0.61–1.24)
Glucose, Bld: 119 mg/dL — ABNORMAL HIGH (ref 70–99)
Glucose, Bld: 118 mg/dL — ABNORMAL HIGH (ref 70–99)
Glucose, Bld: 116 mg/dL — ABNORMAL HIGH (ref 70–99)
Glucose, Bld: 121 mg/dL — ABNORMAL HIGH (ref 70–99)
Glucose, Bld: 146 mg/dL — ABNORMAL HIGH (ref 70–99)
HCT: 35 % — ABNORMAL LOW (ref 39.0–52.0)
HCT: 32 % — ABNORMAL LOW (ref 39.0–52.0)
HCT: 25 % — ABNORMAL LOW (ref 39.0–52.0)
HCT: 23 % — ABNORMAL LOW (ref 39.0–52.0)
HCT: 24 % — ABNORMAL LOW (ref 39.0–52.0)
Hemoglobin: 11.9 g/dL — ABNORMAL LOW (ref 13.0–17.0)
Hemoglobin: 10.9 g/dL — ABNORMAL LOW (ref 13.0–17.0)
Hemoglobin: 8.5 g/dL — ABNORMAL LOW (ref 13.0–17.0)
Hemoglobin: 7.8 g/dL — ABNORMAL LOW (ref 13.0–17.0)
Hemoglobin: 8.2 g/dL — ABNORMAL LOW (ref 13.0–17.0)
Potassium: 4.6 mmol/L (ref 3.5–5.1)
Potassium: 4.4 mmol/L (ref 3.5–5.1)
Potassium: 5.7 mmol/L — ABNORMAL HIGH (ref 3.5–5.1)
Potassium: 5.3 mmol/L — ABNORMAL HIGH (ref 3.5–5.1)
Potassium: 6.3 mmol/L (ref 3.5–5.1)
Sodium: 139 mmol/L (ref 135–145)
Sodium: 139 mmol/L (ref 135–145)
Sodium: 135 mmol/L (ref 135–145)
Sodium: 134 mmol/L — ABNORMAL LOW (ref 135–145)
Sodium: 136 mmol/L (ref 135–145)
TCO2: 31 mmol/L (ref 22–32)
TCO2: 28 mmol/L (ref 22–32)
TCO2: 26 mmol/L (ref 22–32)
TCO2: 25 mmol/L (ref 22–32)
TCO2: 25 mmol/L (ref 22–32)

## 2021-03-29 LAB — GLUCOSE, CAPILLARY
Glucose-Capillary: 117 mg/dL — ABNORMAL HIGH (ref 70–99)
Glucose-Capillary: 122 mg/dL — ABNORMAL HIGH (ref 70–99)
Glucose-Capillary: 97 mg/dL (ref 70–99)
Glucose-Capillary: 135 mg/dL — ABNORMAL HIGH (ref 70–99)
Glucose-Capillary: 93 mg/dL (ref 70–99)

## 2021-03-29 LAB — BASIC METABOLIC PANEL
Anion gap: 9 (ref 5–15)
BUN: 23 mg/dL (ref 8–23)
CO2: 25 mmol/L (ref 22–32)
Calcium: 9.5 mg/dL (ref 8.9–10.3)
Chloride: 104 mmol/L (ref 98–111)
Creatinine, Ser: 1.32 mg/dL — ABNORMAL HIGH (ref 0.61–1.24)
GFR, Estimated: 54 mL/min — ABNORMAL LOW (ref >60)
Glucose, Bld: 104 mg/dL — ABNORMAL HIGH (ref 70–99)
Potassium: 3.8 mmol/L (ref 3.5–5.1)
Sodium: 138 mmol/L (ref 135–145)

## 2021-03-29 LAB — BLOOD GAS, ARTERIAL
Acid-Base Excess: 1.3 mmol/L (ref 0.0–2.0)
Bicarbonate: 25.3 mmol/L (ref 20.0–28.0)
Drawn by: 53527
FIO2: 21
O2 Saturation: 96.4 %
Patient temperature: 36.4
pCO2 arterial: 37.8 mmHg (ref 32.0–48.0)
pH, Arterial: 7.437 (ref 7.350–7.450)
pO2, Arterial: 75.5 mmHg — ABNORMAL LOW (ref 83.0–108.0)

## 2021-03-29 LAB — POCT I-STAT EG7
Acid-Base Excess: 0 mmol/L (ref 0.0–2.0)
Bicarbonate: 24.5 mmol/L (ref 20.0–28.0)
Calcium, Ion: 1 mmol/L — ABNORMAL LOW (ref 1.15–1.40)
HCT: 24 % — ABNORMAL LOW (ref 39.0–52.0)
Hemoglobin: 8.2 g/dL — ABNORMAL LOW (ref 13.0–17.0)
O2 Saturation: 87 %
Potassium: 4.4 mmol/L (ref 3.5–5.1)
Sodium: 135 mmol/L (ref 135–145)
TCO2: 26 mmol/L (ref 22–32)
pCO2, Ven: 36.2 mmHg — ABNORMAL LOW (ref 44.0–60.0)
pH, Ven: 7.44 — ABNORMAL HIGH (ref 7.250–7.430)
pO2, Ven: 51 mmHg — ABNORMAL HIGH (ref 32.0–45.0)

## 2021-03-29 LAB — ABO/RH: ABO/RH(D): B POS

## 2021-03-29 LAB — HEMOGLOBIN AND HEMATOCRIT, BLOOD
HCT: 24.4 % — ABNORMAL LOW (ref 39.0–52.0)
Hemoglobin: 8.4 g/dL — ABNORMAL LOW (ref 13.0–17.0)

## 2021-03-29 LAB — SURGICAL PCR SCREEN
MRSA, PCR: NEGATIVE
Staphylococcus aureus: NEGATIVE

## 2021-03-29 LAB — APTT
aPTT: 53 seconds — ABNORMAL HIGH (ref 24–36)
aPTT: 31 seconds (ref 24–36)

## 2021-03-29 LAB — PLATELET COUNT: Platelets: 126 10*3/uL — ABNORMAL LOW (ref 150–400)

## 2021-03-29 SURGERY — CORONARY ARTERY BYPASS GRAFTING (CABG)
Anesthesia: General | Site: Chest

## 2021-03-29 MED ORDER — LIDOCAINE 2% (20 MG/ML) 5 ML SYRINGE
INTRAMUSCULAR | Status: AC
  Filled 2021-03-29: qty 5

## 2021-03-29 MED ORDER — LACTATED RINGERS IV SOLN: INTRAVENOUS | Status: DC

## 2021-03-29 MED ORDER — HEPARIN SODIUM (PORCINE) 1000 UNIT/ML IJ SOLN
INTRAMUSCULAR | Status: AC
  Filled 2021-03-29: qty 1

## 2021-03-29 MED ORDER — ALBUMIN HUMAN 5 % IV SOLN
250.0000 mL | INTRAVENOUS | Status: AC | PRN
  Administered 2021-03-29 (×4): 12.5 g via INTRAVENOUS
  Filled 2021-03-29 (×2): qty 250

## 2021-03-29 MED ORDER — MIDAZOLAM HCL (PF) 10 MG/2ML IJ SOLN
INTRAMUSCULAR | Status: AC
  Filled 2021-03-29: qty 2

## 2021-03-29 MED ORDER — HEMOSTATIC AGENTS (NO CHARGE) OPTIME
TOPICAL | Status: DC | PRN
  Administered 2021-03-29: 1 via TOPICAL

## 2021-03-29 MED ORDER — LACTATED RINGERS IV SOLN: INTRAVENOUS | Status: DC | PRN

## 2021-03-29 MED ORDER — ASPIRIN 81 MG PO CHEW: 324.0000 mg | CHEWABLE_TABLET | Freq: Every day | ORAL | Status: DC

## 2021-03-29 MED ORDER — METOPROLOL TARTRATE 5 MG/5ML IV SOLN
2.5000 mg | INTRAVENOUS | Status: DC | PRN
  Administered 2021-03-29: 5 mg via INTRAVENOUS
  Filled 2021-03-29: qty 5

## 2021-03-29 MED ORDER — FENTANYL CITRATE (PF) 100 MCG/2ML IJ SOLN: 100.0000 ug | Freq: Once | INTRAMUSCULAR | Status: AC

## 2021-03-29 MED ORDER — SODIUM CHLORIDE 0.9% FLUSH: 3.0000 mL | INTRAVENOUS | Status: DC | PRN

## 2021-03-29 MED ORDER — TRAMADOL HCL 50 MG PO TABS
50.0000 mg | ORAL_TABLET | ORAL | Status: DC | PRN
  Administered 2021-03-30 (×2): 100 mg via ORAL
  Filled 2021-03-29 (×2): qty 2

## 2021-03-29 MED ORDER — METOPROLOL TARTRATE 25 MG/10 ML ORAL SUSPENSION: 12.5000 mg | Freq: Two times a day (BID) | ORAL | Status: DC

## 2021-03-29 MED ORDER — OXYCODONE HCL 5 MG PO TABS
5.0000 mg | ORAL_TABLET | ORAL | Status: DC | PRN
  Administered 2021-03-30: 5 mg via ORAL
  Filled 2021-03-29: qty 1

## 2021-03-29 MED ORDER — FENTANYL CITRATE (PF) 250 MCG/5ML IJ SOLN
INTRAMUSCULAR | Status: AC
  Filled 2021-03-29: qty 25

## 2021-03-29 MED ORDER — PROTAMINE SULFATE 10 MG/ML IV SOLN
INTRAVENOUS | Status: DC | PRN
  Administered 2021-03-29: 230 mg via INTRAVENOUS
  Administered 2021-03-29: 30 mg via INTRAVENOUS

## 2021-03-29 MED ORDER — ACETAMINOPHEN 160 MG/5ML PO SOLN: 650.0000 mg | Freq: Once | ORAL | Status: AC

## 2021-03-29 MED ORDER — CHLORHEXIDINE GLUCONATE 0.12% ORAL RINSE (MEDLINE KIT)
15.0000 mL | Freq: Two times a day (BID) | OROMUCOSAL | Status: DC
  Administered 2021-03-29: 15 mL via OROMUCOSAL

## 2021-03-29 MED ORDER — NITROGLYCERIN IN D5W 200-5 MCG/ML-% IV SOLN: 0.0000 ug/min | INTRAVENOUS | Status: DC

## 2021-03-29 MED ORDER — SODIUM CHLORIDE (PF) 0.9 % IJ SOLN
OROMUCOSAL | Status: DC | PRN
  Administered 2021-03-29: 12 mL via TOPICAL

## 2021-03-29 MED ORDER — ONDANSETRON HCL 4 MG/2ML IJ SOLN
4.0000 mg | Freq: Four times a day (QID) | INTRAMUSCULAR | Status: DC | PRN
  Administered 2021-03-29 – 2021-03-30 (×2): 4 mg via INTRAVENOUS
  Filled 2021-03-29 (×2): qty 2

## 2021-03-29 MED ORDER — MUPIROCIN 2 % EX OINT
1.0000 "application " | TOPICAL_OINTMENT | Freq: Two times a day (BID) | CUTANEOUS | Status: DC
  Administered 2021-03-29 (×2): 1 via NASAL
  Filled 2021-03-29: qty 22

## 2021-03-29 MED ORDER — SODIUM CHLORIDE 0.9% FLUSH
3.0000 mL | Freq: Two times a day (BID) | INTRAVENOUS | Status: DC
  Administered 2021-03-30 – 2021-03-31 (×3): 3 mL via INTRAVENOUS

## 2021-03-29 MED ORDER — PROPOFOL 10 MG/ML IV BOLUS
INTRAVENOUS | Status: DC | PRN
  Administered 2021-03-29: 110 mg via INTRAVENOUS

## 2021-03-29 MED ORDER — SODIUM CHLORIDE 0.9 % IV SOLN
250.0000 mL | INTRAVENOUS | Status: DC
  Administered 2021-03-30: 250 mL via INTRAVENOUS

## 2021-03-29 MED ORDER — ORAL CARE MOUTH RINSE
15.0000 mL | OROMUCOSAL | Status: DC
  Administered 2021-03-29: 15 mL via OROMUCOSAL

## 2021-03-29 MED ORDER — DEXMEDETOMIDINE HCL IN NACL 400 MCG/100ML IV SOLN: 0.0000 ug/kg/h | INTRAVENOUS | Status: DC

## 2021-03-29 MED ORDER — ACETAMINOPHEN 160 MG/5ML PO SOLN: 1000.0000 mg | Freq: Four times a day (QID) | ORAL | Status: DC

## 2021-03-29 MED ORDER — PLASMA-LYTE A IV SOLN
INTRAVENOUS | Status: DC | PRN
  Administered 2021-03-29: 1000 mL via INTRAVASCULAR

## 2021-03-29 MED ORDER — DOCUSATE SODIUM 100 MG PO CAPS
200.0000 mg | ORAL_CAPSULE | Freq: Every day | ORAL | Status: DC
  Administered 2021-03-30 – 2021-03-31 (×2): 200 mg via ORAL
  Filled 2021-03-29 (×2): qty 2

## 2021-03-29 MED ORDER — METOPROLOL TARTRATE 12.5 MG HALF TABLET
12.5000 mg | ORAL_TABLET | Freq: Two times a day (BID) | ORAL | Status: DC
Start: 2021-03-29 — End: 2021-03-31
  Administered 2021-03-29 – 2021-03-31 (×4): 12.5 mg via ORAL
  Filled 2021-03-29 (×4): qty 1

## 2021-03-29 MED ORDER — BISACODYL 5 MG PO TBEC
10.0000 mg | DELAYED_RELEASE_TABLET | Freq: Every day | ORAL | Status: DC
  Administered 2021-03-30 – 2021-03-31 (×2): 10 mg via ORAL
  Filled 2021-03-29 (×2): qty 2

## 2021-03-29 MED ORDER — PHENYLEPHRINE 40 MCG/ML (10ML) SYRINGE FOR IV PUSH (FOR BLOOD PRESSURE SUPPORT)
PREFILLED_SYRINGE | INTRAVENOUS | Status: AC
  Filled 2021-03-29: qty 10

## 2021-03-29 MED ORDER — SODIUM CHLORIDE 0.9 % IV SOLN: INTRAVENOUS | Status: DC

## 2021-03-29 MED ORDER — PANTOPRAZOLE SODIUM 40 MG PO TBEC
40.0000 mg | DELAYED_RELEASE_TABLET | Freq: Every day | ORAL | Status: DC
  Administered 2021-03-31: 40 mg via ORAL
  Filled 2021-03-29: qty 1

## 2021-03-29 MED ORDER — LIDOCAINE 2% (20 MG/ML) 5 ML SYRINGE
INTRAMUSCULAR | Status: DC | PRN
  Administered 2021-03-29: 60 mg via INTRAVENOUS

## 2021-03-29 MED ORDER — ACETAMINOPHEN 500 MG PO TABS
1000.0000 mg | ORAL_TABLET | Freq: Four times a day (QID) | ORAL | Status: DC
  Administered 2021-03-29 – 2021-04-02 (×13): 1000 mg via ORAL
  Filled 2021-03-29 (×12): qty 2

## 2021-03-29 MED ORDER — ACETAMINOPHEN 650 MG RE SUPP
650.0000 mg | Freq: Once | RECTAL | Status: AC
  Administered 2021-03-29: 650 mg via RECTAL

## 2021-03-29 MED ORDER — SUCCINYLCHOLINE CHLORIDE 200 MG/10ML IV SOSY
PREFILLED_SYRINGE | INTRAVENOUS | Status: DC | PRN
  Administered 2021-03-29: 100 mg via INTRAVENOUS

## 2021-03-29 MED ORDER — PROPOFOL 10 MG/ML IV BOLUS
INTRAVENOUS | Status: AC
  Filled 2021-03-29: qty 20

## 2021-03-29 MED ORDER — LACTATED RINGERS IV SOLN
500.0000 mL | Freq: Once | INTRAVENOUS | Status: AC | PRN
  Administered 2021-03-30: 500 mL via INTRAVENOUS

## 2021-03-29 MED ORDER — INSULIN ASPART 100 UNIT/ML IJ SOLN
0.0000 [IU] | INTRAMUSCULAR | Status: DC
  Administered 2021-03-30 (×2): 2 [IU] via SUBCUTANEOUS
  Administered 2021-03-30: 4 [IU] via SUBCUTANEOUS
  Administered 2021-03-30 (×2): 2 [IU] via SUBCUTANEOUS

## 2021-03-29 MED ORDER — LACTATED RINGERS IV SOLN
INTRAVENOUS | Status: DC | PRN
Start: 2021-03-29 — End: 2021-03-29

## 2021-03-29 MED ORDER — ASPIRIN EC 325 MG PO TBEC
325.0000 mg | DELAYED_RELEASE_TABLET | Freq: Every day | ORAL | Status: DC
  Administered 2021-03-30 – 2021-03-31 (×2): 325 mg via ORAL
  Filled 2021-03-29 (×2): qty 1

## 2021-03-29 MED ORDER — PHENYLEPHRINE 40 MCG/ML (10ML) SYRINGE FOR IV PUSH (FOR BLOOD PRESSURE SUPPORT)
PREFILLED_SYRINGE | INTRAVENOUS | Status: DC | PRN
  Administered 2021-03-29: 20 ug via INTRAVENOUS
  Administered 2021-03-29: 80 ug via INTRAVENOUS
  Administered 2021-03-29 (×3): 40 ug via INTRAVENOUS
  Administered 2021-03-29: 80 ug via INTRAVENOUS

## 2021-03-29 MED ORDER — CHLORHEXIDINE GLUCONATE 0.12 % MT SOLN: 15.0000 mL | OROMUCOSAL | Status: DC

## 2021-03-29 MED ORDER — INSULIN REGULAR(HUMAN) IN NACL 100-0.9 UT/100ML-% IV SOLN: INTRAVENOUS | Status: DC

## 2021-03-29 MED ORDER — ALBUMIN HUMAN 5 % IV SOLN: INTRAVENOUS | Status: DC | PRN

## 2021-03-29 MED ORDER — ROCURONIUM BROMIDE 10 MG/ML (PF) SYRINGE
PREFILLED_SYRINGE | INTRAVENOUS | Status: AC
  Filled 2021-03-29: qty 10

## 2021-03-29 MED ORDER — MIDAZOLAM HCL 2 MG/2ML IJ SOLN
INTRAMUSCULAR | Status: AC
  Filled 2021-03-29: qty 2

## 2021-03-29 MED ORDER — SODIUM CHLORIDE 0.9 % IV SOLN: INTRAVENOUS | Status: DC | PRN

## 2021-03-29 MED ORDER — ARTIFICIAL TEARS OPHTHALMIC OINT
TOPICAL_OINTMENT | OPHTHALMIC | Status: DC | PRN
  Administered 2021-03-29: 1 via OPHTHALMIC

## 2021-03-29 MED ORDER — VANCOMYCIN HCL IN DEXTROSE 1-5 GM/200ML-% IV SOLN
1000.0000 mg | Freq: Once | INTRAVENOUS | Status: AC
  Administered 2021-03-30: 1000 mg via INTRAVENOUS
  Filled 2021-03-29: qty 200

## 2021-03-29 MED ORDER — ONDANSETRON HCL 4 MG/2ML IJ SOLN
INTRAMUSCULAR | Status: DC | PRN
  Administered 2021-03-29: 4 mg via INTRAVENOUS

## 2021-03-29 MED ORDER — DEXTROSE 50 % IV SOLN: 0.0000 mL | INTRAVENOUS | Status: DC | PRN

## 2021-03-29 MED ORDER — FAMOTIDINE IN NACL 20-0.9 MG/50ML-% IV SOLN
20.0000 mg | Freq: Two times a day (BID) | INTRAVENOUS | Status: DC
  Administered 2021-03-29: 20 mg via INTRAVENOUS
  Filled 2021-03-29: qty 50

## 2021-03-29 MED ORDER — CEFAZOLIN SODIUM-DEXTROSE 2-4 GM/100ML-% IV SOLN
2.0000 g | Freq: Three times a day (TID) | INTRAVENOUS | Status: DC
  Administered 2021-03-29 – 2021-03-31 (×5): 2 g via INTRAVENOUS
  Filled 2021-03-29 (×6): qty 100

## 2021-03-29 MED ORDER — ORAL CARE MOUTH RINSE: 15.0000 mL | Freq: Two times a day (BID) | OROMUCOSAL | Status: DC

## 2021-03-29 MED ORDER — HEPARIN SODIUM (PORCINE) 1000 UNIT/ML IJ SOLN
INTRAMUSCULAR | Status: DC | PRN
  Administered 2021-03-29: 2000 [IU] via INTRAVENOUS
  Administered 2021-03-29: 24000 [IU] via INTRAVENOUS

## 2021-03-29 MED ORDER — PROTAMINE SULFATE 10 MG/ML IV SOLN
INTRAVENOUS | Status: AC
  Filled 2021-03-29: qty 25

## 2021-03-29 MED ORDER — MIDAZOLAM HCL 2 MG/2ML IJ SOLN: 2.0000 mg | INTRAMUSCULAR | Status: DC | PRN

## 2021-03-29 MED ORDER — PHENYLEPHRINE HCL-NACL 20-0.9 MG/250ML-% IV SOLN: 0.0000 ug/min | INTRAVENOUS | Status: DC

## 2021-03-29 MED ORDER — SODIUM CHLORIDE 0.45 % IV SOLN: INTRAVENOUS | Status: DC | PRN

## 2021-03-29 MED ORDER — ARTIFICIAL TEARS OPHTHALMIC OINT
TOPICAL_OINTMENT | OPHTHALMIC | Status: AC
  Filled 2021-03-29: qty 3.5

## 2021-03-29 MED ORDER — 0.9 % SODIUM CHLORIDE (POUR BTL) OPTIME
TOPICAL | Status: DC | PRN
  Administered 2021-03-29: 5000 mL

## 2021-03-29 MED ORDER — ROCURONIUM BROMIDE 10 MG/ML (PF) SYRINGE
PREFILLED_SYRINGE | INTRAVENOUS | Status: DC | PRN
  Administered 2021-03-29 (×3): 50 mg via INTRAVENOUS

## 2021-03-29 MED ORDER — BISACODYL 10 MG RE SUPP: 10.0000 mg | Freq: Every day | RECTAL | Status: DC

## 2021-03-29 MED ORDER — MORPHINE SULFATE (PF) 2 MG/ML IV SOLN
1.0000 mg | INTRAVENOUS | Status: DC | PRN
  Administered 2021-03-29: 2 mg via INTRAVENOUS
  Filled 2021-03-29: qty 1

## 2021-03-29 MED ORDER — FENTANYL CITRATE (PF) 100 MCG/2ML IJ SOLN
INTRAMUSCULAR | Status: AC
  Administered 2021-03-29: 100 ug via INTRAVENOUS
  Filled 2021-03-29: qty 2

## 2021-03-29 MED ORDER — MAGNESIUM SULFATE 4 GM/100ML IV SOLN
4.0000 g | Freq: Once | INTRAVENOUS | Status: AC
  Administered 2021-03-29: 4 g via INTRAVENOUS
  Filled 2021-03-29: qty 100

## 2021-03-29 MED ORDER — POTASSIUM CHLORIDE 10 MEQ/50ML IV SOLN
10.0000 meq | INTRAVENOUS | Status: AC
Start: 2021-03-29 — End: 2021-03-29

## 2021-03-29 MED ORDER — FENTANYL CITRATE (PF) 250 MCG/5ML IJ SOLN
INTRAMUSCULAR | Status: DC | PRN
  Administered 2021-03-29 (×2): 100 ug via INTRAVENOUS
  Administered 2021-03-29 (×4): 150 ug via INTRAVENOUS
  Administered 2021-03-29: 100 ug via INTRAVENOUS

## 2021-03-29 MED ORDER — CHLORHEXIDINE GLUCONATE CLOTH 2 % EX PADS
6.0000 | MEDICATED_PAD | Freq: Every day | CUTANEOUS | Status: DC
  Administered 2021-03-29 – 2021-03-30 (×2): 6 via TOPICAL

## 2021-03-29 SURGICAL SUPPLY — 90 items
BAG DECANTER FOR FLEXI CONT (MISCELLANEOUS) ×3 IMPLANT
BLADE CLIPPER SURG (BLADE) ×3 IMPLANT
BLADE STERNUM SYSTEM 6 (BLADE) ×3 IMPLANT
BNDG ELASTIC 4X5.8 VLCR STR LF (GAUZE/BANDAGES/DRESSINGS) ×3 IMPLANT
BNDG ELASTIC 6X5.8 VLCR STR LF (GAUZE/BANDAGES/DRESSINGS) ×3 IMPLANT
BNDG GAUZE ELAST 4 BULKY (GAUZE/BANDAGES/DRESSINGS) ×3 IMPLANT
CANISTER SUCT 3000ML PPV (MISCELLANEOUS) ×3 IMPLANT
CANNULA EZ GLIDE AORTIC 21FR (CANNULA) ×3 IMPLANT
CATH CPB KIT HENDRICKSON (MISCELLANEOUS) ×3 IMPLANT
CATH ROBINSON RED A/P 18FR (CATHETERS) ×3 IMPLANT
CATH THORACIC 36FR (CATHETERS) ×3 IMPLANT
CATH THORACIC 36FR RT ANG (CATHETERS) ×3 IMPLANT
CLIP RETRACTION 3.0MM CORONARY (MISCELLANEOUS) ×1 IMPLANT
CLIP TI WIDE RED SMALL 24 (CLIP) ×2 IMPLANT
CLIP VESOCCLUDE MED 24/CT (CLIP) IMPLANT
CLIP VESOCCLUDE SM WIDE 24/CT (CLIP) IMPLANT
CNTNR URN SCR LID CUP LEK RST (MISCELLANEOUS) IMPLANT
CONT SPEC 4OZ STRL OR WHT (MISCELLANEOUS) ×3
CONTAINER PROTECT SURGISLUSH (MISCELLANEOUS) ×3 IMPLANT
DEFOGGER ANTIFOG KIT (MISCELLANEOUS) ×1 IMPLANT
DRAPE CARDIOVASCULAR INCISE (DRAPES) ×3
DRAPE SRG 135X102X78XABS (DRAPES) ×2 IMPLANT
DRAPE WARM FLUID 44X44 (DRAPES) ×3 IMPLANT
DRSG COVADERM 4X14 (GAUZE/BANDAGES/DRESSINGS) ×3 IMPLANT
ELECT REM PT RETURN 9FT ADLT (ELECTROSURGICAL) ×6
ELECTRODE REM PT RTRN 9FT ADLT (ELECTROSURGICAL) ×4 IMPLANT
FELT TEFLON 1X6 (MISCELLANEOUS) ×5 IMPLANT
GAUZE SPONGE 2X2 8PLY NS (GAUZE/BANDAGES/DRESSINGS) ×1 IMPLANT
GAUZE SPONGE 4X4 12PLY STRL (GAUZE/BANDAGES/DRESSINGS) ×6 IMPLANT
GAUZE SPONGE 4X4 12PLY STRL LF (GAUZE/BANDAGES/DRESSINGS) ×1 IMPLANT
GAUZE XEROFORM 5X9 LF (GAUZE/BANDAGES/DRESSINGS) ×1 IMPLANT
GLOVE SURG SIGNA 7.5 PF LTX (GLOVE) ×9 IMPLANT
GOWN STRL REUS W/ TWL LRG LVL3 (GOWN DISPOSABLE) ×8 IMPLANT
GOWN STRL REUS W/ TWL XL LVL3 (GOWN DISPOSABLE) ×4 IMPLANT
GOWN STRL REUS W/TWL LRG LVL3 (GOWN DISPOSABLE) ×12
GOWN STRL REUS W/TWL XL LVL3 (GOWN DISPOSABLE) ×6
HANDLE STAPLE ENDO GIA SHORT (STAPLE) ×1
HEMOSTAT POWDER SURGIFOAM 1G (HEMOSTASIS) ×9 IMPLANT
HEMOSTAT SURGICEL 2X14 (HEMOSTASIS) ×3 IMPLANT
KIT BASIN OR (CUSTOM PROCEDURE TRAY) ×3 IMPLANT
KIT SUCTION CATH 14FR (SUCTIONS) ×6 IMPLANT
KIT TURNOVER KIT B (KITS) ×3 IMPLANT
KIT VASOVIEW HEMOPRO 2 VH 4000 (KITS) ×3 IMPLANT
MARKER GRAFT CORONARY BYPASS (MISCELLANEOUS) ×9 IMPLANT
NS IRRIG 1000ML POUR BTL (IV SOLUTION) ×15 IMPLANT
PACK E OPEN HEART (SUTURE) ×3 IMPLANT
PACK OPEN HEART (CUSTOM PROCEDURE TRAY) ×3 IMPLANT
PAD ARMBOARD 7.5X6 YLW CONV (MISCELLANEOUS) ×6 IMPLANT
PAD ELECT DEFIB RADIOL ZOLL (MISCELLANEOUS) ×3 IMPLANT
PENCIL BUTTON HOLSTER BLD 10FT (ELECTRODE) ×3 IMPLANT
POSITIONER HEAD DONUT 9IN (MISCELLANEOUS) ×3 IMPLANT
PUNCH AORTIC ROTATE 4.0MM (MISCELLANEOUS) IMPLANT
PUNCH AORTIC ROTATE 4.5MM 8IN (MISCELLANEOUS) ×1 IMPLANT
PUNCH AORTIC ROTATE 5MM 8IN (MISCELLANEOUS) IMPLANT
RELOAD EGIA 45 MED/THCK PURPLE (STAPLE) ×3 IMPLANT
SET MPS 3-ND DEL (MISCELLANEOUS) ×1 IMPLANT
STAPLER ENDO GIA 12 SHRT THIN (STAPLE) IMPLANT
STAPLER ENDO GIA 12MM SHORT (STAPLE) ×2 IMPLANT
SUPPORT HEART JANKE-BARRON (MISCELLANEOUS) ×3 IMPLANT
SUT BONE WAX W31G (SUTURE) ×3 IMPLANT
SUT MNCRL AB 4-0 PS2 18 (SUTURE) IMPLANT
SUT PROLENE 3 0 SH DA (SUTURE) ×3 IMPLANT
SUT PROLENE 4 0 RB 1 (SUTURE) ×3
SUT PROLENE 4 0 SH DA (SUTURE) IMPLANT
SUT PROLENE 4-0 RB1 .5 CRCL 36 (SUTURE) IMPLANT
SUT PROLENE 6 0 C 1 30 (SUTURE) ×9 IMPLANT
SUT PROLENE 7 0 BV 1 (SUTURE) ×4 IMPLANT
SUT PROLENE 7 0 BV1 MDA (SUTURE) ×4 IMPLANT
SUT PROLENE 8 0 BV175 6 (SUTURE) IMPLANT
SUT STEEL 6MS V (SUTURE) ×3 IMPLANT
SUT STEEL STERNAL CCS#1 18IN (SUTURE) IMPLANT
SUT STEEL SZ 6 DBL 3X14 BALL (SUTURE) ×3 IMPLANT
SUT VIC AB 1 CTX 36 (SUTURE) ×6
SUT VIC AB 1 CTX36XBRD ANBCTR (SUTURE) ×4 IMPLANT
SUT VIC AB 2-0 CT1 27 (SUTURE) ×3
SUT VIC AB 2-0 CT1 TAPERPNT 27 (SUTURE) IMPLANT
SUT VIC AB 2-0 CTX 27 (SUTURE) IMPLANT
SUT VIC AB 3-0 SH 27 (SUTURE)
SUT VIC AB 3-0 SH 27X BRD (SUTURE) IMPLANT
SUT VIC AB 3-0 X1 27 (SUTURE) IMPLANT
SUT VICRYL 4-0 PS2 18IN ABS (SUTURE) IMPLANT
SYSTEM SAHARA CHEST DRAIN ATS (WOUND CARE) ×3 IMPLANT
TAPE CLOTH SURG 4X10 WHT LF (GAUZE/BANDAGES/DRESSINGS) ×1 IMPLANT
TAPE PAPER 2X10 WHT MICROPORE (GAUZE/BANDAGES/DRESSINGS) ×1 IMPLANT
TOWEL GREEN STERILE (TOWEL DISPOSABLE) ×3 IMPLANT
TOWEL GREEN STERILE FF (TOWEL DISPOSABLE) ×3 IMPLANT
TRAY FOLEY SLVR 16FR TEMP STAT (SET/KITS/TRAYS/PACK) ×3 IMPLANT
TUBING LAP HI FLOW INSUFFLATIO (TUBING) ×3 IMPLANT
UNDERPAD 30X36 HEAVY ABSORB (UNDERPADS AND DIAPERS) ×3 IMPLANT
WATER STERILE IRR 1000ML POUR (IV SOLUTION) ×6 IMPLANT

## 2021-03-29 NOTE — Progress Notes (Signed)
  Echocardiogram Echocardiogram Transesophageal has been performed.  Steven Valenzuela 03/19/2021, 1:16 PM

## 2021-03-29 NOTE — Progress Notes (Signed)
RT notified readiness to wean 

## 2021-03-29 NOTE — Anesthesia Preprocedure Evaluation (Addendum)
Anesthesia Evaluation  Patient identified by MRN, date of birth, ID band Patient awake    Reviewed: Allergy & Precautions, NPO status , Patient's Chart, lab work & pertinent test results  History of Anesthesia Complications Negative for: history of anesthetic complications  Airway Mallampati: II  TM Distance: >3 FB Neck ROM: Full    Dental  (+) Dental Advisory Given   Pulmonary former smoker,    breath sounds clear to auscultation       Cardiovascular hypertension, + angina + CAD   Rhythm:Regular  1. Left ventricular ejection fraction, by estimation, is 60 to 65%. The  left ventricle has normal function. The left ventricle has no regional  wall motion abnormalities. There is mild left ventricular hypertrophy.  Left ventricular diastolic parameters  are consistent with Grade II diastolic dysfunction (pseudonormalization).  Elevated left ventricular end-diastolic pressure. The E/e' is 23.  2. Right ventricular systolic function is normal. The right ventricular  size is normal. There is normal pulmonary artery systolic pressure. The  estimated right ventricular systolic pressure is 01.7 mmHg.  3. The mitral valve is degenerative. Mild mitral valve regurgitation. No  evidence of mitral stenosis. Mild-moderate MAC.  4. The aortic valve is grossly normal. There is mild calcification of the  aortic valve. Aortic valve regurgitation is not visualized. No aortic  stenosis is present.  5. The inferior vena cava is normal in size with greater than 50%  respiratory variability, suggesting right atrial pressure of 3 mmHg.   Colon Flattery RCA to Prox RCA lesion is 70% stenosed. .  Dist RCA-1 lesion is 90% stenosed. .  Dist RCA-2 lesion is 75% stenosed. .  Prox Cx lesion is 75% stenosed. .  1st Mrg lesion is 80% stenosed. Colon Flattery LM to Mid LM lesion is 70% stenosed. .  Mid LAD lesion is 75% stenosed. .  Prox LAD lesion is 50% stenosed. .   The left ventricular systolic function is normal. .  LV end diastolic pressure is normal. .  The left ventricular ejection fraction is 55-65% by visual estimate. .  There is no aortic valve stenosis.  Severe multivessel CAD including ostial LAD disease that causes significant pressure dampening.   Plan for cardiac surgery consult.     Neuro/Psych    GI/Hepatic Neg liver ROS, GERD  Medicated and Controlled,  Endo/Other  Hypothyroidism   Renal/GU CRFRenal diseaseLab Results      Component                Value               Date                      CREATININE               1.32 (H)            03/06/2021                Musculoskeletal  (+) Arthritis ,   Abdominal   Peds  Hematology negative hematology ROS (+) Lab Results      Component                Value               Date                      WBC  6.0                 03/09/2021                HGB                      13.7                03/08/2021                HCT                      40.4                03/17/2021                MCV                      91.0                03/18/2021                PLT                      191                 03/19/2021              Anesthesia Other Findings   Reproductive/Obstetrics                            Anesthesia Physical Anesthesia Plan  ASA: 4  Anesthesia Plan: General   Post-op Pain Management:    Induction: Intravenous  PONV Risk Score and Plan: 2 and Treatment may vary due to age or medical condition  Airway Management Planned: Oral ETT  Additional Equipment: Arterial line, CVP, PA Cath, TEE and Ultrasound Guidance Line Placement  Intra-op Plan:   Post-operative Plan: Extubation in OR  Informed Consent: I have reviewed the patients History and Physical, chart, labs and discussed the procedure including the risks, benefits and alternatives for the proposed anesthesia with the patient or authorized  representative who has indicated his/her understanding and acceptance.     Dental advisory given  Plan Discussed with: CRNA and Anesthesiologist  Anesthesia Plan Comments:         Anesthesia Quick Evaluation

## 2021-03-29 NOTE — Anesthesia Procedure Notes (Signed)
Arterial Line Insertion Start/End08/21/2022 11:20 AM, 03/18/2021 11:30 AM Performed by: Betha Loa, CRNA, Griffin Dakin, CRNA, CRNA  Patient location: Pre-op. Preanesthetic checklist: patient identified, IV checked, site marked, risks and benefits discussed, surgical consent, monitors and equipment checked, pre-op evaluation, timeout performed and anesthesia consent Lidocaine 1% used for infiltration radial was placed Catheter size: 20 G Hand hygiene performed  and maximum sterile barriers used   Attempts: 1 Procedure performed without using ultrasound guided technique. Following insertion, dressing applied and Biopatch. Post procedure assessment: normal and unchanged  Patient tolerated the procedure well with no immediate complications.

## 2021-03-29 NOTE — Anesthesia Procedure Notes (Signed)
Arterial Line Insertion Start/End08/14/2022 12:51 PM, 04/01/2021 12:56 PM Performed by: Oleta Mouse, MD, anesthesiologist  Patient location: OR. Preanesthetic checklist: patient identified, IV checked, site marked, risks and benefits discussed, surgical consent, monitors and equipment checked, pre-op evaluation, timeout performed and anesthesia consent Left, radial was placed Catheter size: 20 G Hand hygiene performed  and maximum sterile barriers used   Attempts: 1 Procedure performed without using ultrasound guided technique. Following insertion, dressing applied and Biopatch. Post procedure assessment: normal and unchanged  Patient tolerated the procedure well with no immediate complications.

## 2021-03-29 NOTE — Anesthesia Procedure Notes (Signed)
Procedure Name: Intubation Date/Time: 03/25/2021 12:45 PM Performed by: Harden Mo, CRNA Pre-anesthesia Checklist: Patient identified, Emergency Drugs available, Suction available and Patient being monitored Patient Re-evaluated:Patient Re-evaluated prior to induction Oxygen Delivery Method: Circle System Utilized Preoxygenation: Pre-oxygenation with 100% oxygen Induction Type: IV induction, Rapid sequence and Cricoid Pressure applied Laryngoscope Size: Miller and 2 Grade View: Grade I Tube type: Oral Tube size: 8.0 mm Number of attempts: 1 Airway Equipment and Method: Stylet and Oral airway Placement Confirmation: ETT inserted through vocal cords under direct vision, positive ETCO2 and breath sounds checked- equal and bilateral Secured at: 23 cm Tube secured with: Tape Dental Injury: Teeth and Oropharynx as per pre-operative assessment

## 2021-03-29 NOTE — Interval H&P Note (Signed)
History and Physical Interval Note:  03/11/2021 11:14 AM  Steven Valenzuela  has presented today for surgery, with the diagnosis of CAD.  The various methods of treatment have been discussed with the patient and family. After consideration of risks, benefits and other options for treatment, the patient has consented to  Procedure(s): CORONARY ARTERY BYPASS GRAFTING (CABG) (N/A) TRANSESOPHAGEAL ECHOCARDIOGRAM (TEE) (N/A) as a surgical intervention.  The patient's history has been reviewed, patient examined, no change in status, stable for surgery.  I have reviewed the patient's chart and labs.  Questions were answered to the patient's satisfaction.     [name]

## 2021-03-29 NOTE — Transfer of Care (Signed)
Immediate Anesthesia Transfer of Care Note  Patient: Steven Valenzuela  Procedure(s) Performed: CORONARY ARTERY BYPASS GRAFTING (CABG), ON PUMP, TIMES FOUR, USING LEFT INTERNAL MAMMARY ARTERY AND RIGHT ENDOSCOPICALLY HARVESTED GREATER SAPHENOUS VEIN (Chest) TRANSESOPHAGEAL ECHOCARDIOGRAM (TEE)  Patient Location: SICU  Anesthesia Type:General  Level of Consciousness: Patient remains intubated per anesthesia plan  Airway & Oxygen Therapy: Patient remains intubated per anesthesia plan and Patient placed on Ventilator (see vital sign flow sheet for setting)  Post-op Assessment: Report given to RN and Post -op Vital signs reviewed and stable  Post vital signs: Reviewed and stable  Last Vitals:  Vitals Value Taken Time  BP 109/51   Temp    Pulse 70 03/28/2021 1821  Resp 12 03/05/2021 1821  SpO2 99 % 03/31/2021 1821  Vitals shown include unvalidated device data.  Last Pain:  Vitals:   03/25/2021 1220  TempSrc:   PainSc: 0-No pain         Complications: No notable events documented.

## 2021-03-29 NOTE — Procedures (Signed)
Extubation Procedure Note  Patient Details:   Name: Steven Valenzuela DOB: 1935/10/22 MRN: [id number]   Airway Documentation:    Vent end date: 03/22/2021 Vent end time: 2310   Evaluation  O2 sats: stable throughout Complications: No apparent complications Patient did tolerate procedure well. Bilateral Breath Sounds: Clear, Diminished   Yes  Pt tolerated rapid wean well. VC 820, NIF -20, positive for cuff leak, extubated to 4L [place]. No dyspnea or stridor noted after extubation.   Mariam Dollar 03/12/2021, 11:22 PM

## 2021-03-29 NOTE — Brief Op Note (Signed)
03/20/2021 - 03/25/2021  9:58 AM  PATIENT:  Steven Valenzuela  85 y.o. male  PRE-OPERATIVE DIAGNOSIS:  Coronary Artery Disease  POST-OPERATIVE DIAGNOSIS:  Coronary Artery Disease  PROCEDURE:  Procedure(s): CORONARY ARTERY BYPASS GRAFTING (CABG), ON PUMP, TIMES FOUR, USING LEFT INTERNAL MAMMARY ARTERY AND RIGHT ENDOSCOPICALLY HARVESTED GREATER SAPHENOUS VEIN (N/A) TRANSESOPHAGEAL ECHOCARDIOGRAM (TEE) (N/A) LIMA-LAD SVG-PDA SVG-OM1-OM2 EVH 55 MIN LUNG WEDGE RESECTION (LUL)  SURGEON:  Surgeon(s) and Role:    * [name], MD - Primary  PHYSICIAN ASSISTANT: Cheryle Dark PA-C  ASSISTANTS: STAFF   ANESTHESIA:   general  EBL:  669 mL   BLOOD ADMINISTERED:none  DRAINS:  LEFT PLEURAL AND MEDIASTINAL CHEST TUBES    LOCAL MEDICATIONS USED:  NONE  SPECIMEN:  LUNG WEDGE RESECTION  DISPOSITION OF SPECIMEN:   PATHOLOGY AND MICRO  COUNTS:  YES  TOURNIQUET:  * No tourniquets in log *  DICTATION: .Other Dictation: Dictation Number PENDING  PLAN OF CARE: Admit to inpatient   PATIENT DISPOSITION:  ICU - intubated and hemodynamically stable.   Delay start of Pharmacological VTE agent (>24hrs) due to surgical blood loss or risk of bleeding: yes  COMPLICATIONS: NO KNOWN

## 2021-03-29 NOTE — Progress Notes (Addendum)
Progress Note  Patient Name: Steven Valenzuela Date of Encounter: 03/23/2021  [facility] Cardiologist: None   Subjective   Planned for CABG today  Inpatient Medications    Scheduled Meds:  aspirin EC  81 mg Oral Daily   carvedilol  3.125 mg Oral BID WC   Chlorhexidine Gluconate Cloth  6 each Topical Once   epinephrine  0-10 mcg/min Intravenous To OR   heparin  5,000 Units Subcutaneous Q8H   heparin-papaverine-plasmalyte irrigation   Irrigation To OR   insulin   Intravenous To OR   levothyroxine  75 mcg Oral QAC breakfast   magnesium sulfate  40 mEq Other To OR   mupirocin ointment  1 application Nasal BID   pantoprazole  80 mg Oral QAC breakfast   phenylephrine  30-200 mcg/min Intravenous To OR   potassium chloride  80 mEq Other To OR   rosuvastatin  10 mg Oral Daily   sodium chloride flush  3 mL Intravenous Q12H   sodium chloride flush  3 mL Intravenous Q12H   tranexamic acid  15 mg/kg Intravenous To OR   tranexamic acid  2 mg/kg Intracatheter To OR   cyanocobalamin  1,000 mcg Oral Daily   Continuous Infusions:  sodium chloride      ceFAZolin (ANCEF) IV      ceFAZolin (ANCEF) IV     dexmedetomidine     heparin 30,000 units/NS 1000 mL solution for CELLSAVER     milrinone     nitroGLYCERIN     norepinephrine     tranexamic acid (CYKLOKAPRON) infusion ([facility])     vancomycin     PRN Meds: sodium chloride, acetaminophen, acetaminophen, ALPRAZolam, nitroGLYCERIN, ondansetron (ZOFRAN) IV, sodium chloride flush   Vital Signs    Vitals:   03/28/21 0510 03/28/21 1224 03/28/21 1956 03/14/2021 0425  BP: (!) 136/57 112/64 135/74 136/72  Pulse: 83 66 75 80  Resp: _0 Temp: 97.7 F (36.5 C) 97.6 F (36.4 C) 97.8 F (36.6 C) (!) 97.5 F (36.4 C)  TempSrc: Oral Oral Oral Oral  SpO2: 98%  98% 98%  Weight: 80 kg   (!) 175.5 kg  Height:        Intake/Output Summary (Last 24 hours) at 03/22/2021 0740 Last data filed at 03/28/2021 2142 Gross per 24 hour   Intake 240 ml  Output 675 ml  Net -435 ml   Last 3 Weights 03/28/2021 03/28/2021 03/08/2021  Weight (lbs) 386 lb 14.5 oz 176 lb 5.9 oz 175 lb 9.6 oz  Weight (kg) 175.5 kg 80 kg 79.652 kg      Telemetry    SR with PACs - Personally Reviewed  ECG    No new tracing   Physical Exam   GEN: No acute distress.   Neck: No JVD Cardiac: RRR, no murmurs, rubs, or gallops.  Respiratory: Clear to auscultation bilaterally. GI: Soft, nontender, non-distended  MS: No edema; No deformity. Right radial cath site stable. Neuro:  Nonfocal  Psych: Normal affect   Labs    High Sensitivity Troponin:   Recent Labs  Lab 03/25/2021 1132 03/22/2021 1353  TROPONINIHS 8 10      Chemistry Recent Labs  Lab 03/07/2021 1132 03/16/2021 1941 03/18/2021 0211 03/11/2021 0450  NA 138  --  140 138  K 4.5  --  3.8 3.8  CL 102  --  107 104  CO2 26  --  28 25  GLUCOSE 93  --  93 104*  BUN 18  --  16 23  CREATININE 1.20 1.15 1.25* 1.32*  CALCIUM 9.1  --  8.9 9.5  PROT 7.0  --   --   --   ALBUMIN 3.8  --   --   --   AST 20  --   --   --   ALT 13  --   --   --   ALKPHOS 57  --   --   --   BILITOT 0.5  --   --   --   GFRNONAA >60 >60 57* 54*  ANIONGAP 10  --  5 9     Hematology Recent Labs  Lab 03/16/2021 1941 04/01/2021 0211 03/23/2021 0450  WBC 6.3 5.6 6.0  RBC 4.17* 3.87* 4.44  HGB 12.9* 12.3* 13.7  HCT 38.4* 35.4* 40.4  MCV 92.1 91.5 91.0  MCH 30.9 31.8 30.9  MCHC 33.6 34.7 33.9  RDW 13.3 13.2 13.2  PLT 194 169 191    BNP Recent Labs  Lab 03/28/2021 1132  BNP 90.0     DDimer No results for input(s): DDIMER in the last 168 hours.   Radiology    CARDIAC CATHETERIZATION  Result Date: 03/13/2021   Ost RCA to Prox RCA lesion is 70% stenosed.   Dist RCA-1 lesion is 90% stenosed.   Dist RCA-2 lesion is 75% stenosed.   Prox Cx lesion is 75% stenosed.   1st Mrg lesion is 80% stenosed.   Ost LM to Mid LM lesion is 70% stenosed.   Mid LAD lesion is 75% stenosed.   Prox LAD lesion is 50% stenosed.    The left ventricular systolic function is normal.   LV end diastolic pressure is normal.   The left ventricular ejection fraction is 55-65% by visual estimate.   There is no aortic valve stenosis. Severe multivessel CAD including ostial LAD disease that causes significant pressure dampening. Plan for cardiac surgery consult.   ECHOCARDIOGRAM COMPLETE  Result Date: 03/18/2021    ECHOCARDIOGRAM REPORT   Patient Name:   Steven Valenzuela Date of Exam: 03/24/2021 Medical Rec #:  [id number]       Height:       70.5 in Accession #:    [id number]      Weight:       175.6 lb Date of Birth:  Jan 13, 1936       BSA:          1.985 m Patient Age:    85 years        BP:           144/70 mmHg Patient Gender: M               HR:           63 bpm. Exam Location:  Inpatient Procedure: 2D Echo Indications:    chest pain  History:        Patient has no prior history of Echocardiogram examinations.                 CAD; Risk Factors:Dyslipidemia.  Sonographer:    Johny Chess RDCS Referring Phys: [id number] [place]  1. Left ventricular ejection fraction, by estimation, is 60 to 65%. The left ventricle has normal function. The left ventricle has no regional wall motion abnormalities. There is mild left ventricular hypertrophy. Left ventricular diastolic parameters are consistent with Grade II diastolic dysfunction (pseudonormalization). Elevated left ventricular end-diastolic pressure. The E/e' is 93.  2. Right ventricular systolic function is normal. The right ventricular  size is normal. There is normal pulmonary artery systolic pressure. The estimated right ventricular systolic pressure is 21.1 mmHg.  3. The mitral valve is degenerative. Mild mitral valve regurgitation. No evidence of mitral stenosis. Mild-moderate MAC.  4. The aortic valve is grossly normal. There is mild calcification of the aortic valve. Aortic valve regurgitation is not visualized. No aortic stenosis is present.  5. The inferior vena cava  is normal in size with greater than 50% respiratory variability, suggesting right atrial pressure of 3 mmHg. FINDINGS  Left Ventricle: Left ventricular ejection fraction, by estimation, is 60 to 65%. The left ventricle has normal function. The left ventricle has no regional wall motion abnormalities. The left ventricular internal cavity size was normal in size. There is  mild left ventricular hypertrophy. Left ventricular diastolic parameters are consistent with Grade II diastolic dysfunction (pseudonormalization). Elevated left ventricular end-diastolic pressure. The E/e' is 67. Right Ventricle: The right ventricular size is normal. No increase in right ventricular wall thickness. Right ventricular systolic function is normal. There is normal pulmonary artery systolic pressure. The tricuspid regurgitant velocity is 2.78 m/s, and  with an assumed right atrial pressure of 3 mmHg, the estimated right ventricular systolic pressure is 94.1 mmHg. Left Atrium: Left atrial size was normal in size. Right Atrium: Right atrial size was normal in size. Pericardium: There is no evidence of pericardial effusion. Mitral Valve: The mitral valve is degenerative in appearance. Mild to moderate mitral annular calcification. Mild mitral valve regurgitation. No evidence of mitral valve stenosis. Tricuspid Valve: The tricuspid valve is normal in structure. Tricuspid valve regurgitation is mild . No evidence of tricuspid stenosis. Aortic Valve: The aortic valve is grossly normal. There is mild calcification of the aortic valve. Aortic valve regurgitation is not visualized. No aortic stenosis is present. Pulmonic Valve: The pulmonic valve was normal in structure. Pulmonic valve regurgitation is mild. No evidence of pulmonic stenosis. Aorta: The aortic root is normal in size and structure. Venous: The inferior vena cava is normal in size with greater than 50% respiratory variability, suggesting right atrial pressure of 3 mmHg. IAS/Shunts:  No atrial level shunt detected by color flow Doppler.  LEFT VENTRICLE PLAX 2D LVIDd:         4.40 cm  Diastology LVIDs:         2.80 cm  LV e' medial:    6.09 cm/s LV PW:         1.00 cm  LV E/e' medial:  22.8 LV IVS:        1.00 cm  LV e' lateral:   7.72 cm/s LVOT diam:     1.80 cm  LV E/e' lateral: 18.0 LV SV:         60 LV SV Index:   30 LVOT Area:     2.54 cm  RIGHT VENTRICLE             IVC RV S prime:     12.60 cm/s  IVC diam: 1.80 cm TAPSE (M-mode): 2.6 cm LEFT ATRIUM             Index       RIGHT ATRIUM           Index LA diam:        4.20 cm 2.12 cm/m  RA Area:     12.20 cm LA Vol (A2C):   53.6 ml 27.00 ml/m RA Volume:   26.80 ml  13.50 ml/m LA Vol (A4C):   55.5 ml 27.95 ml/m  LA Biplane Vol: 57.4 ml 28.91 ml/m  AORTIC VALVE LVOT Vmax:   97.40 cm/s LVOT Vmean:  64.900 cm/s LVOT VTI:    0.237 m  AORTA Ao Root diam: 3.60 cm Ao Asc diam:  3.00 cm MITRAL VALVE                TRICUSPID VALVE MV Area (PHT): 3.08 cm     TR Peak grad:   30.9 mmHg MV Decel Time: 246 msec     TR Vmax:        278.00 cm/s MV E velocity: 139.00 cm/s MV A velocity: 110.00 cm/s  SHUNTS MV E/A ratio:  1.26         Systemic VTI:  0.24 m                             Systemic Diam: 1.80 cm Cherlynn Kaiser MD Electronically signed by Cherlynn Kaiser MD Signature Date/Time: 03/10/2021/5:24:23 PM    Final    VAS US DOPPLER PRE CABG  Result Date: 03/28/2021 PREOPERATIVE VASCULAR EVALUATION Patient Name:  Steven Valenzuela  Date of Exam:   03/28/2021 Medical Rec #: [id number]        Accession #:    [id number] Date of Birth: [birth year]        Patient Gender: M Patient Age:   61 years Exam Location:  [facility] Procedure:      VAS US DOPPLER PRE CABG Referring Phys: Remo Lipps HENDRICKSON --------------------------------------------------------------------------------  Indications:      Pre-CABG. Risk Factors:     Hypertension, hyperlipidemia, past history of smoking,                   coronary artery disease. Other Factors:    CKD.  Comparison Study: No previous exams available for review Performing Technologist: Hill, Jody RVT, RDMS  Examination Guidelines: A complete evaluation includes B-mode imaging, spectral Doppler, color Doppler, and power Doppler as needed of all accessible portions of each vessel. Bilateral testing is considered an integral part of a complete examination. Limited examinations for reoccurring indications may be performed as noted.  Right Carotid Findings: +----------+--------+--------+--------+------------------+------------------+           PSV cm/sEDV cm/sStenosisDescribe          Comments           +----------+--------+--------+--------+------------------+------------------+ CCA Prox  95      11                                intimal thickening +----------+--------+--------+--------+------------------+------------------+ CCA Distal80      13              calcific and focalintimal thickening +----------+--------+--------+--------+------------------+------------------+ ICA Prox  115     28      1-39%   heterogenous                         +----------+--------+--------+--------+------------------+------------------+ ICA Distal115     21                                                   +----------+--------+--------+--------+------------------+------------------+ ECA       129     0                                                    +----------+--------+--------+--------+------------------+------------------+ +----------+--------+-------+-------------------------------------+------------+  PSV cm/sEDV cmsDescribe                             Arm Pressure +----------+--------+-------+-------------------------------------+------------+ Subclavian213            Elevated velocities without evidence                                       of stenosis                                        +----------+--------+-------+-------------------------------------+------------+ +---------+--------+--+--------+-+----------------------------+ VertebralPSV cm/s22EDV cm/s0Antegrade and High resistant +---------+--------+--+--------+-+----------------------------+ Left Carotid Findings: +----------+--------+--------+--------+------------+-------------------+           PSV cm/sEDV cm/sStenosisDescribe    Comments            +----------+--------+--------+--------+------------+-------------------+ CCA Prox  97      13                          intimal thickening  +----------+--------+--------+--------+------------+-------------------+ CCA Distal92      17                          intimal thickening  +----------+--------+--------+--------+------------+-------------------+ ICA Prox  85      20      1-39%   heterogenous                    +----------+--------+--------+--------+------------+-------------------+ ICA Distal58      12                          not well visualized +----------+--------+--------+--------+------------+-------------------+ ECA       226     0       >50%    calcific    calcific shadowing  +----------+--------+--------+--------+------------+-------------------+ +----------+--------+--------+----------------+------------+ SubclavianPSV cm/sEDV cm/sDescribe        Arm Pressure +----------+--------+--------+----------------+------------+           123             Multiphasic, WNL             +----------+--------+--------+----------------+------------+ +---------+--------+--+--------+--+---------+ VertebralPSV cm/s44EDV cm/s12Antegrade +---------+--------+--+--------+--+---------+  ABI Findings: +--------+------------------+-----+---------+--------+ Right   Rt Pressure (mmHg)IndexWaveform Comment  +--------+------------------+-----+---------+--------+ [id number]                    triphasic          +--------+------------------+-----+---------+--------+ PTA     189               1.26 biphasic          +--------+------------------+-----+---------+--------+ DP      163               1.09 biphasic          +--------+------------------+-----+---------+--------+ +--------+------------------+-----+----------+-------+ Left    Lt Pressure (mmHg)IndexWaveform  Comment +--------+------------------+-----+----------+-------+ Brachial150                    triphasic         +--------+------------------+-----+----------+-------+ PTA     162               1.08 monophasic        +--------+------------------+-----+----------+-------+ DP      169  1.13 biphasic          +--------+------------------+-----+----------+-------+ +-------+---------------+----------------+ ABI/TBIToday's ABI/TBIPrevious ABI/TBI +-------+---------------+----------------+ Right  1.26                            +-------+---------------+----------------+ Left   1.13                            +-------+---------------+----------------+  Right Doppler Findings: +--------+--------+-----+---------+--------+ Site    PressureIndexDoppler  Comments +--------+--------+-----+---------+--------+ [id number]          triphasic         +--------+--------+-----+---------+--------+ Radial               triphasic         +--------+--------+-----+---------+--------+ Ulnar                biphasic          +--------+--------+-----+---------+--------+  Left Doppler Findings: +--------+--------+-----+---------+--------+ Site    PressureIndexDoppler  Comments +--------+--------+-----+---------+--------+ Brachial150          triphasic         +--------+--------+-----+---------+--------+ Radial               triphasic         +--------+--------+-----+---------+--------+ Ulnar                triphasic         +--------+--------+-----+---------+--------+  Summary: Right  Carotid: Velocities in the right ICA are consistent with a 1-39% stenosis.                The extracranial vessels were near-normal with only minimal wall                thickening or plaque. Left Carotid: Velocities in the left ICA are consistent with a 1-39% stenosis.               The ECA appears >50% stenosed. Vertebrals:  Bilateral vertebral arteries demonstrate antegrade flow. Right              vertebral artery demonstrates high resistant flow. Subclavians: Normal flow hemodynamics were seen in the left subclavian artery.              Right subclavian artery shows elevated velocities without evidence              of stenosis. Right ABI: Resting right ankle-brachial index is within normal range. No evidence of significant right lower extremity arterial disease. Left ABI: Resting left ankle-brachial index is within normal range. No evidence of significant left lower extremity arterial disease. Right Upper Extremity: Abnormal PPG waveforms with radial artery compression suggest an incomplete palmar arch. Doppler waveform obliterate with right radial compression. Doppler waveforms remain within normal limits with right ulnar compression. Left Upper Extremity: Normal PPG waveforms with radial artery compression suggest palmar arch patency. Doppler waveforms remain within normal limits with left radial compression. Doppler waveforms remain within normal limits with left ulnar compression.  Electronically signed by Harold Barban MD on 03/28/2021 at 8:12:32 PM.    Final    VAS [place] LOWER EXTREMITY VENOUS (DVT)  Result Date: 03/28/2021  Lower Venous DVT Study Patient Name:  Steven Valenzuela  Date of Exam:   03/28/2021 Medical Rec #: [id number]        Accession #:    [id number] Date of Birth: 05/27/1936        Patient Gender: M Patient Age:   53  years Exam Location:  [facility] Procedure:      VAS [place] LOWER EXTREMITY VENOUS (DVT) Referring Phys: Remo Lipps HENDRICKSON  --------------------------------------------------------------------------------  Indications: Pain in left leg for a few months (dull/achy).  Comparison Study: No previous exams Performing Technologist: Jody Hill RVT, RDMS  Examination Guidelines: A complete evaluation includes B-mode imaging, spectral Doppler, color Doppler, and power Doppler as needed of all accessible portions of each vessel. Bilateral testing is considered an integral part of a complete examination. Limited examinations for reoccurring indications may be performed as noted. The reflux portion of the exam is performed with the patient in reverse Trendelenburg.  +-----+---------------+---------+-----------+----------+--------------+ RIGHTCompressibilityPhasicitySpontaneityPropertiesThrombus Aging +-----+---------------+---------+-----------+----------+--------------+ CFV  Full           Yes      Yes                                 +-----+---------------+---------+-----------+----------+--------------+   +---------+---------------+---------+-----------+----------+--------------+ LEFT     CompressibilityPhasicitySpontaneityPropertiesThrombus Aging +---------+---------------+---------+-----------+----------+--------------+ CFV      Full           Yes      Yes                                 +---------+---------------+---------+-----------+----------+--------------+ SFJ      Full                                                        +---------+---------------+---------+-----------+----------+--------------+ FV Prox  Full           Yes      Yes                                 +---------+---------------+---------+-----------+----------+--------------+ FV Mid   Full           Yes      Yes                                 +---------+---------------+---------+-----------+----------+--------------+ FV DistalFull           Yes      Yes                                  +---------+---------------+---------+-----------+----------+--------------+ PFV      Full                                                        +---------+---------------+---------+-----------+----------+--------------+ POP      Full           Yes      Yes                                 +---------+---------------+---------+-----------+----------+--------------+ PTV      Full                                                        +---------+---------------+---------+-----------+----------+--------------+  PERO     Full                                                        +---------+---------------+---------+-----------+----------+--------------+     *See table(s) above for measurements and observations. Electronically signed by Harold Barban MD on 03/28/2021 at 8:12:07 PM.    Final     Cardiac Studies   Cath: 04/04/2021   Ost RCA to Prox RCA lesion is 70% stenosed.   Dist RCA-1 lesion is 90% stenosed.   Dist RCA-2 lesion is 75% stenosed.   Prox Cx lesion is 75% stenosed.   1st Mrg lesion is 80% stenosed.   Ost LM to Mid LM lesion is 70% stenosed.   Mid LAD lesion is 75% stenosed.   Prox LAD lesion is 50% stenosed.   The left ventricular systolic function is normal.   LV end diastolic pressure is normal.   The left ventricular ejection fraction is 55-65% by visual estimate.   There is no aortic valve stenosis.   Severe multivessel CAD including ostial LAD disease that causes significant pressure dampening.    Plan for cardiac surgery consult.   Diagnostic Dominance: Right  Echo: 03/16/2021  IMPRESSIONS    1. Left ventricular ejection fraction, by estimation, is 60 to 65%. The  left ventricle has normal function. The left ventricle has no regional  wall motion abnormalities. There is mild left ventricular hypertrophy.  Left ventricular diastolic parameters  are consistent with Grade II diastolic dysfunction (pseudonormalization).  Elevated left ventricular  end-diastolic pressure. The E/e' is 23.   2. Right ventricular systolic function is normal. The right ventricular  size is normal. There is normal pulmonary artery systolic pressure. The  estimated right ventricular systolic pressure is 66.4 mmHg.   3. The mitral valve is degenerative. Mild mitral valve regurgitation. No  evidence of mitral stenosis. Mild-moderate MAC.   4. The aortic valve is grossly normal. There is mild calcification of the  aortic valve. Aortic valve regurgitation is not visualized. No aortic  stenosis is present.   5. The inferior vena cava is normal in size with greater than 50%  respiratory variability, suggesting right atrial pressure of 3 mmHg.   FINDINGS   Left Ventricle: Left ventricular ejection fraction, by estimation, is 60  to 65%. The left ventricle has normal function. The left ventricle has no  regional wall motion abnormalities. The left ventricular internal cavity  size was normal in size. There is   mild left ventricular hypertrophy. Left ventricular diastolic parameters  are consistent with Grade II diastolic dysfunction (pseudonormalization).  Elevated left ventricular end-diastolic pressure. The E/e' is 2.   Right Ventricle: The right ventricular size is normal. No increase in  right ventricular wall thickness. Right ventricular systolic function is  normal. There is normal pulmonary artery systolic pressure. The tricuspid  regurgitant velocity is 2.78 m/s, and   with an assumed right atrial pressure of 3 mmHg, the estimated right  ventricular systolic pressure is 40.3 mmHg.   Left Atrium: Left atrial size was normal in size.   Right Atrium: Right atrial size was normal in size.   Pericardium: There is no evidence of pericardial effusion.   Mitral Valve: The mitral valve is degenerative in appearance. Mild to  moderate mitral annular calcification. Mild mitral valve regurgitation. No  evidence of mitral valve stenosis.   Tricuspid Valve:  The tricuspid valve is normal in structure. Tricuspid  valve regurgitation is mild . No evidence of tricuspid stenosis.   Aortic Valve: The aortic valve is grossly normal. There is mild  calcification of the aortic valve. Aortic valve regurgitation is not  visualized. No aortic stenosis is present.   Pulmonic Valve: The pulmonic valve was normal in structure. Pulmonic valve  regurgitation is mild. No evidence of pulmonic stenosis.   Aorta: The aortic root is normal in size and structure.   Venous: The inferior vena cava is normal in size with greater than 50%  respiratory variability, suggesting right atrial pressure of 3 mmHg.   IAS/Shunts: No atrial level shunt detected by color flow Doppler.       Patient Profile     85 y.o. male  with newly diagnosed severe multivessel coronary artery disease going for planned CABG 03/21/2021 per Dr. Roxan Hockey  Assessment & Plan    Unstable angina: underwent cardiac cath noted above with severe 3v disease. Evaluated by [facility], Dr. Roxan Hockey and felt to be candidate for CAGB planned for today. EF 60-65% with g2DD on echo. -- remains on ASA, coreg, low dose statin   HLD: has been intolerant to statins in the past. Willing to try low dose statin which has been started -- LDL 142 -- on Crestor 76m daily   HTN: stable with coreg (switched from atenolol on admission)  For questions or updates, please contact [place] consult [email or web address] for contact info under        Signed, LReino Bellis NP  03/20/2021, 7:40 AM    Patient seen, examined. Available data reviewed. Agree with findings, assessment, and plan as outlined by LReino Bellis NP.  Wife and daughter at bedside.  Patient doing fine with no chest pain.  Eager to have surgery today.  Questions were answered.  Agree with findings outlined above.  MSherren Mocha M.D. 03/22/2021 10:01 AM

## 2021-03-30 ENCOUNTER — Inpatient Hospital Stay (HOSPITAL_COMMUNITY): Payer: PPO

## 2021-03-30 ENCOUNTER — Encounter (HOSPITAL_COMMUNITY): Payer: Self-pay | Admitting: Thoracic Surgery (Cardiothoracic Vascular Surgery)

## 2021-03-30 DIAGNOSIS — I200 Unstable angina: Secondary | ICD-10-CM | POA: Diagnosis not present

## 2021-03-30 LAB — POCT I-STAT 7, (LYTES, BLD GAS, ICA,H+H)
Acid-Base Excess: 0 mmol/L (ref 0.0–2.0)
Acid-base deficit: 1 mmol/L (ref 0.0–2.0)
Acid-base deficit: 5 mmol/L — ABNORMAL HIGH (ref 0.0–2.0)
Acid-base deficit: 6 mmol/L — ABNORMAL HIGH (ref 0.0–2.0)
Bicarbonate: 20 mmol/L (ref 20.0–28.0)
Bicarbonate: 21.2 mmol/L (ref 20.0–28.0)
Bicarbonate: 24.5 mmol/L (ref 20.0–28.0)
Bicarbonate: 25.8 mmol/L (ref 20.0–28.0)
Calcium, Ion: 1.17 mmol/L (ref 1.15–1.40)
Calcium, Ion: 1.17 mmol/L (ref 1.15–1.40)
Calcium, Ion: 1.18 mmol/L (ref 1.15–1.40)
Calcium, Ion: 1.2 mmol/L (ref 1.15–1.40)
HCT: 23 % — ABNORMAL LOW (ref 39.0–52.0)
HCT: 24 % — ABNORMAL LOW (ref 39.0–52.0)
HCT: 25 % — ABNORMAL LOW (ref 39.0–52.0)
HCT: 26 % — ABNORMAL LOW (ref 39.0–52.0)
Hemoglobin: 7.8 g/dL — ABNORMAL LOW (ref 13.0–17.0)
Hemoglobin: 8.2 g/dL — ABNORMAL LOW (ref 13.0–17.0)
Hemoglobin: 8.5 g/dL — ABNORMAL LOW (ref 13.0–17.0)
Hemoglobin: 8.8 g/dL — ABNORMAL LOW (ref 13.0–17.0)
O2 Saturation: 100 %
O2 Saturation: 98 %
O2 Saturation: 99 %
O2 Saturation: 99 %
Patient temperature: 35.8
Patient temperature: 35.9
Patient temperature: 36.8
Patient temperature: 37
Potassium: 4.3 mmol/L (ref 3.5–5.1)
Potassium: 4.5 mmol/L (ref 3.5–5.1)
Potassium: 4.6 mmol/L (ref 3.5–5.1)
Potassium: 4.9 mmol/L (ref 3.5–5.1)
Sodium: 138 mmol/L (ref 135–145)
Sodium: 140 mmol/L (ref 135–145)
Sodium: 140 mmol/L (ref 135–145)
Sodium: 142 mmol/L (ref 135–145)
TCO2: 21 mmol/L — ABNORMAL LOW (ref 22–32)
TCO2: 23 mmol/L (ref 22–32)
TCO2: 26 mmol/L (ref 22–32)
TCO2: 27 mmol/L (ref 22–32)
pCO2 arterial: 38.8 mmHg (ref 32.0–48.0)
pCO2 arterial: 41.8 mmHg (ref 32.0–48.0)
pCO2 arterial: 43.2 mmHg (ref 32.0–48.0)
pCO2 arterial: 43.6 mmHg (ref 32.0–48.0)
pH, Arterial: 7.299 — ABNORMAL LOW (ref 7.350–7.450)
pH, Arterial: 7.32 — ABNORMAL LOW (ref 7.350–7.450)
pH, Arterial: 7.352 (ref 7.350–7.450)
pH, Arterial: 7.392 (ref 7.350–7.450)
pO2, Arterial: 110 mmHg — ABNORMAL HIGH (ref 83.0–108.0)
pO2, Arterial: 136 mmHg — ABNORMAL HIGH (ref 83.0–108.0)
pO2, Arterial: 137 mmHg — ABNORMAL HIGH (ref 83.0–108.0)
pO2, Arterial: 171 mmHg — ABNORMAL HIGH (ref 83.0–108.0)

## 2021-03-30 LAB — CBC
HCT: 27.8 % — ABNORMAL LOW (ref 39.0–52.0)
HCT: 27.8 % — ABNORMAL LOW (ref 39.0–52.0)
Hemoglobin: 9.2 g/dL — ABNORMAL LOW (ref 13.0–17.0)
Hemoglobin: 9.4 g/dL — ABNORMAL LOW (ref 13.0–17.0)
MCH: 31 pg (ref 26.0–34.0)
MCH: 31.8 pg (ref 26.0–34.0)
MCHC: 33.1 g/dL (ref 30.0–36.0)
MCHC: 33.8 g/dL (ref 30.0–36.0)
MCV: 93.6 fL (ref 80.0–100.0)
MCV: 93.9 fL (ref 80.0–100.0)
Platelets: 103 10*3/uL — ABNORMAL LOW (ref 150–400)
Platelets: 120 10*3/uL — ABNORMAL LOW (ref 150–400)
RBC: 2.97 MIL/uL — ABNORMAL LOW (ref 4.22–5.81)
RBC: 2.96 MIL/uL — ABNORMAL LOW (ref 4.22–5.81)
RDW: 13.5 % (ref 11.5–15.5)
RDW: 14.1 % (ref 11.5–15.5)
WBC: 7 10*3/uL (ref 4.0–10.5)
WBC: 11.1 10*3/uL — ABNORMAL HIGH (ref 4.0–10.5)
nRBC: 0 % (ref 0.0–0.2)
nRBC: 0 % (ref 0.0–0.2)

## 2021-03-30 LAB — GLUCOSE, CAPILLARY
Glucose-Capillary: 133 mg/dL — ABNORMAL HIGH (ref 70–99)
Glucose-Capillary: 137 mg/dL — ABNORMAL HIGH (ref 70–99)
Glucose-Capillary: 141 mg/dL — ABNORMAL HIGH (ref 70–99)
Glucose-Capillary: 149 mg/dL — ABNORMAL HIGH (ref 70–99)
Glucose-Capillary: 152 mg/dL — ABNORMAL HIGH (ref 70–99)
Glucose-Capillary: 161 mg/dL — ABNORMAL HIGH (ref 70–99)

## 2021-03-30 LAB — BASIC METABOLIC PANEL
Anion gap: 7 (ref 5–15)
Anion gap: 8 (ref 5–15)
BUN: 17 mg/dL (ref 8–23)
BUN: 21 mg/dL (ref 8–23)
CO2: 22 mmol/L (ref 22–32)
CO2: 24 mmol/L (ref 22–32)
Calcium: 8.3 mg/dL — ABNORMAL LOW (ref 8.9–10.3)
Calcium: 8.4 mg/dL — ABNORMAL LOW (ref 8.9–10.3)
Chloride: 108 mmol/L (ref 98–111)
Chloride: 102 mmol/L (ref 98–111)
Creatinine, Ser: 1.11 mg/dL (ref 0.61–1.24)
Creatinine, Ser: 1.5 mg/dL — ABNORMAL HIGH (ref 0.61–1.24)
GFR, Estimated: >60 mL/min (ref >60)
GFR, Estimated: 46 mL/min — ABNORMAL LOW (ref >60)
Glucose, Bld: 152 mg/dL — ABNORMAL HIGH (ref 70–99)
Glucose, Bld: 138 mg/dL — ABNORMAL HIGH (ref 70–99)
Potassium: 4.5 mmol/L (ref 3.5–5.1)
Potassium: 4 mmol/L (ref 3.5–5.1)
Sodium: 137 mmol/L (ref 135–145)
Sodium: 134 mmol/L — ABNORMAL LOW (ref 135–145)

## 2021-03-30 LAB — MAGNESIUM
Magnesium: 3 mg/dL — ABNORMAL HIGH (ref 1.7–2.4)
Magnesium: 2.7 mg/dL — ABNORMAL HIGH (ref 1.7–2.4)

## 2021-03-30 MED ORDER — FUROSEMIDE 10 MG/ML IJ SOLN
40.0000 mg | Freq: Once | INTRAMUSCULAR | Status: AC
  Administered 2021-03-30: 40 mg via INTRAVENOUS
  Filled 2021-03-30: qty 4

## 2021-03-30 MED ORDER — ORAL CARE MOUTH RINSE
15.0000 mL | Freq: Two times a day (BID) | OROMUCOSAL | Status: DC
  Administered 2021-03-30 – 2021-04-01 (×5): 15 mL via OROMUCOSAL

## 2021-03-30 MED ORDER — AMLODIPINE BESYLATE 10 MG PO TABS
10.0000 mg | ORAL_TABLET | Freq: Every day | ORAL | Status: DC
  Administered 2021-03-30 – 2021-04-02 (×4): 10 mg via ORAL
  Filled 2021-03-30 (×4): qty 1

## 2021-03-30 MED ORDER — INSULIN ASPART 100 UNIT/ML IJ SOLN
0.0000 [IU] | Freq: Three times a day (TID) | INTRAMUSCULAR | Status: DC
  Administered 2021-03-30: 2 [IU] via SUBCUTANEOUS

## 2021-03-30 MED ORDER — ENOXAPARIN SODIUM 40 MG/0.4ML IJ SOSY
40.0000 mg | PREFILLED_SYRINGE | Freq: Every day | INTRAMUSCULAR | Status: DC
  Administered 2021-03-30: 40 mg via SUBCUTANEOUS
  Filled 2021-03-30: qty 0.4

## 2021-03-30 NOTE — Progress Notes (Signed)
1 Day Post-Op Procedure(s) (LRB): CORONARY ARTERY BYPASS GRAFTING (CABG), ON PUMP, TIMES FOUR, USING LEFT INTERNAL MAMMARY ARTERY AND RIGHT ENDOSCOPICALLY HARVESTED GREATER SAPHENOUS VEIN (N/A) TRANSESOPHAGEAL ECHOCARDIOGRAM (TEE) (N/A) Subjective: C/o pain with deep breath, constipation  Objective: Vital signs in last 24 hours: Temp:  [95.7 F (35.4 C)-99.3 F (37.4 C)] 98.4 F (36.9 C) (08/26 0700) Pulse Rate:  [43-89] 80 (08/26 0700) Cardiac Rhythm: Atrial paced;Heart block (08/26 0400) Resp:  [10-40] 14 (08/26 0700) BP: (71-185)/(55-85) 101/61 (08/26 0400) SpO2:  [94 %-100 %] 94 % (08/26 0700) Arterial Line BP: (80-163)/(42-83) 140/53 (08/26 0700) FiO2 (%):  [40 %-50 %] 40 % (08/25 2240) Weight:  [81.6 kg-84.5 kg] 84.5 kg (08/26 0500)  Hemodynamic parameters for last 24 hours: PAP: (13-31)/(6-18) 25/12 CVP:  [4 mmHg-15 mmHg] 10 mmHg CO:  [2.6 L/min-4.5 L/min] 3.7 L/min CI:  [1.3 L/min/m2-2.3 L/min/m2] 1.9 L/min/m2  Intake/Output from previous day: 08/25 0701 - 08/26 0700 In: 6242.8 [P.O.:680; I.V.:2959.3; Blood:390; IV Piggyback:2213.5] Out: [id number] [Urine:2990; Blood:669; Chest Tube:440] Intake/Output this shift: No intake/output data recorded.  General appearance: alert, cooperative, and no distress Neurologic: intact Heart: regular rate and rhythm and + rub Lungs: diminished breath sounds bibasilar Abdomen: normal findings: soft, non-tender  Lab Results: Recent Labs    03/20/2021 1830 03/24/2021 1838 03/28/2021 0010 03/24/2021 0321  WBC 8.9  --   --  7.0  HGB 9.8*   < > 8.5* 9.2*  HCT 29.4*   < > 25.0* 27.8*  PLT 111*  --   --  103*   < > = values in this interval not displayed.   BMET:  Recent Labs    03/16/2021 0450 03/16/2021 1250 03/16/2021 1714 03/21/2021 1838 03/13/2021 0010 03/25/2021 0321  NA 138   < > 136   < > 140 137  K 3.8   < > 5.3*   < > 4.6 4.5  CL 104   < > 102  --   --  108  CO2 25  --   --   --   --  22  GLUCOSE 104*   < > 146*  --   --  152*  BUN  23   < > 19  --   --  17  CREATININE 1.32*   < > 0.80  --   --  1.11  CALCIUM 9.5  --   --   --   --  8.3*   < > = values in this interval not displayed.    PT/INR:  Recent Labs    03/12/2021 1830  LABPROT 16.2*  INR 1.3*   ABG    Component Value Date/Time   PHART 7.299 (L) 03/15/2021 0010   HCO3 21.2 03/18/2021 0010   TCO2 23 03/16/2021 0010   ACIDBASEDEF 5.0 (H) 03/09/2021 0010   O2SAT 98.0 03/18/2021 0010   CBG (last 3)  Recent Labs    03/14/2021 0008 03/12/2021 0321 03/07/2021 0732  GLUCAP 149* 161* 141*    Assessment/Plan: S/P Procedure(s) (LRB): CORONARY ARTERY BYPASS GRAFTING (CABG), ON PUMP, TIMES FOUR, USING LEFT INTERNAL MAMMARY ARTERY AND RIGHT ENDOSCOPICALLY HARVESTED GREATER SAPHENOUS VEIN (N/A) TRANSESOPHAGEAL ECHOCARDIOGRAM (TEE) (N/A) POD # 1 Doing well CV- in SR  Hypertensive on NTG drip- beta blocker, start amlodipine  Good cardiac index- dc Swan  ECG shows early repolarization, + rub, likely pericarditis RESP- IS for atelectasis RENAL- creatinine normal  Volume overloaded as expected- diurese ENDo- CBG moderately elevated, continue SSI GI- advance diet as tolerated SCD +  enoxaparin for DVT prophylaxis Dc chest tubes Cardiac rehab   LOS: 3 days    [name] 04/01/2021

## 2021-03-30 NOTE — Progress Notes (Signed)
Progress Note  Patient Name: Steven Valenzuela Date of Encounter: 03/25/2021  [facility] Cardiologist: None   Subjective   The patient is doing well this morning.  Currently on IV nitroglycerin for blood pressure.  Inpatient Medications    Scheduled Meds:  acetaminophen  1,000 mg Oral Q6H   Or   acetaminophen (TYLENOL) oral liquid 160 mg/5 mL  1,000 mg Per Tube Q6H   amLODipine  10 mg Oral Daily   aspirin EC  325 mg Oral Daily   Or   aspirin  324 mg Per Tube Daily   bisacodyl  10 mg Oral Daily   Or   bisacodyl  10 mg Rectal Daily   Chlorhexidine Gluconate Cloth  6 each Topical Daily   docusate sodium  200 mg Oral Daily   enoxaparin (LOVENOX) injection  40 mg Subcutaneous QHS   insulin aspart  0-24 Units Subcutaneous Q4H   levothyroxine  75 mcg Oral QAC breakfast   mouth rinse  15 mL Mouth Rinse BID   metoprolol tartrate  12.5 mg Oral BID   Or   metoprolol tartrate  12.5 mg Per Tube BID   [START ON 03/31/2021] pantoprazole  40 mg Oral Daily   rosuvastatin  10 mg Oral Daily   sodium chloride flush  3 mL Intravenous Q12H   Continuous Infusions:  sodium chloride 250 mL (03/24/2021 0716)    ceFAZolin (ANCEF) IV Stopped (03/11/2021 0610)   lactated ringers 20 mL/hr at 03/31/2021 1000   nitroGLYCERIN 20 mcg/min (04/03/2021 1000)   phenylephrine (NEO-SYNEPHRINE) Adult infusion Stopped (04/03/2021 1951)   PRN Meds: metoprolol tartrate, midazolam, morphine injection, ondansetron (ZOFRAN) IV, oxyCODONE, sodium chloride flush, traMADol   Vital Signs    Vitals:   83/16/2022 0915 04/01/2021 0930 03/28/2021 0945 03/14/2021 1000  BP:      Pulse: 80 80 81 80  Resp: 16 (!) 21 18 (!) 25  Temp: 98.6 F (37 C) 98.2 F (36.8 C) 98.2 F (36.8 C)   TempSrc:      SpO2: 95% 95% 95% 97%  Weight:      Height:        Intake/Output Summary (Last 24 hours) at 03/28/2021 1116 Last data filed at 03/23/2021 1000 Gross per 24 hour  Intake 6364.66 ml  Output 4174 ml  Net 2190.66 ml   Last 3  Weights 83/10/2020 04/04/2021 04/03/2021  Weight (lbs) 186 lb 4.6 oz 180 lb 386 lb 14.5 oz  Weight (kg) 84.5 kg 81.647 kg 175.5 kg      Telemetry    Atrial pacing- Personally Reviewed  ECG    Alert, oriented, 85-year-old male in NAD - Personally Reviewed  Physical Exam  Alert, oriented, NAD GEN: No acute distress.   Neck: No JVD Cardiac: RRR, friction rub noted Respiratory: Clear to auscultation bilaterally. GI: Soft, nontender, non-distended  MS: No edema; No deformity. Neuro:  Nonfocal  Psych: Normal affect   Labs    High Sensitivity Troponin:   Recent Labs  Lab 03/21/2021 1132 03/06/2021 1353  TROPONINIHS 8 10      Chemistry Recent Labs  Lab 03/05/2021 1132 03/11/2021 1941 03/28/2021 0211 03/17/2021 0450 03/25/2021 1250 04/03/2021 1632 04/03/2021 1657 03/09/2021 1714 04/04/2021 1838 03/05/2021 2301 03/08/2021 0010 03/17/2021 0321  NA 138  --  140 138   < > 134*   < > 136   < > 142 140 137  K 4.5  --  3.8 3.8   < > 6.3*   < > 5.3*   < >  4.3 4.6 4.5  CL 102  --  107 104   < > 101  --  102  --   --   --  108  CO2 26  --  28 25  --   --   --   --   --   --   --  22  GLUCOSE 93  --  93 104*   < > 121*  --  146*  --   --   --  152*  BUN 18  --  16 23   < > 19  --  19  --   --   --  17  CREATININE 1.20   < > 1.25* 1.32*   < > 0.90  --  0.80  --   --   --  1.11  CALCIUM 9.1  --  8.9 9.5  --   --   --   --   --   --   --  8.3*  PROT 7.0  --   --   --   --   --   --   --   --   --   --   --   ALBUMIN 3.8  --   --   --   --   --   --   --   --   --   --   --   AST 20  --   --   --   --   --   --   --   --   --   --   --   ALT 13  --   --   --   --   --   --   --   --   --   --   --   ALKPHOS 57  --   --   --   --   --   --   --   --   --   --   --   BILITOT 0.5  --   --   --   --   --   --   --   --   --   --   --   GFRNONAA >60   < > 57* 54*  --   --   --   --   --   --   --  >60  ANIONGAP 10  --  5 9  --   --   --   --   --   --   --  7   < > = values in this interval not displayed.      Hematology Recent Labs  Lab 04/01/2021 0450 03/16/2021 1250 03/13/2021 1622 03/15/2021 1632 03/25/2021 1830 03/24/2021 1838 03/14/2021 2301 03/21/2021 0010 03/20/2021 0321  WBC 6.0  --   --   --  8.9  --   --   --  7.0  RBC 4.44  --   --   --  3.17*  --   --   --  2.97*  HGB 13.7   < > 8.4*   < > 9.8*   < > 7.8* 8.5* 9.2*  HCT 40.4   < > 24.4*   < > 29.4*   < > 23.0* 25.0* 27.8*  MCV 91.0  --   --   --  92.7  --   --   --  93.6  MCH 30.9  --   --   --  30.9  --   --   --  31.0  MCHC 33.9  --   --   --  33.3  --   --   --  33.1  RDW 13.2  --   --   --  13.2  --   --   --  13.5  PLT 191  --  126*  --  111*  --   --   --  103*   < > = values in this interval not displayed.    BNP Recent Labs  Lab 03/12/2021 1132  BNP 90.0     DDimer No results for input(s): DDIMER in the last 168 hours.   Radiology    DG Chest Port 1 View  Result Date: 03/21/2021 CLINICAL DATA:  Status post cardiac surgery. EXAM: PORTABLE CHEST 1 VIEW COMPARISON:  March 29, 2021. FINDINGS: Stable cardiomediastinal silhouette. Endotracheal and nasogastric tubes have been removed. Right internal jugular Swan-Ganz catheter is unchanged in position. Stable position of left-sided chest tube is noted with small left apical pneumothorax. Hypoinflation of the lungs is noted with mild bibasilar subsegmental atelectasis. Bony thorax is unremarkable. IMPRESSION: Endotracheal and nasogastric tubes have been removed. Stable position of left-sided chest tube is noted with small left apical pneumothorax. Hypoinflation of the lungs is noted with mild bibasilar subsegmental atelectasis. Aortic Atherosclerosis (ICD10-I70.0). Electronically Signed   By: Marijo Conception M.D.   On: 03/20/2021 08:48   DG Chest Port 1 View  Result Date: 03/06/2021 CLINICAL DATA:  Chest pain, shortness of breath, unresponsive EXAM: PORTABLE CHEST 1 VIEW COMPARISON:  03/08/2021 FINDINGS: Endotracheal tube is 9 cm above the carina. Swan-Ganz catheter tip in the main  pulmonary artery. NG tube is in the stomach. Left chest tube in place. Tiny left apical pneumothorax. Changes of CABG. No confluent opacity or effusions. IMPRESSION: Postoperative changes.  Support devices in expected position. Tiny left apical pneumothorax. Electronically Signed   By: Rolm Baptise M.D.   On: 03/06/2021 18:30   VAS US DOPPLER PRE CABG  Result Date: 03/28/2021 PREOPERATIVE VASCULAR EVALUATION Patient Name:  SHAINA BEAUDET  Date of Exam:   03/28/2021 Medical Rec #: [id number]        Accession #:    [id number] Date of Birth: 07-Jul-1936        Patient Gender: M Patient Age:   85 years Exam Location:  [facility] Procedure:      VAS US DOPPLER PRE CABG Referring Phys: Remo Lipps HENDRICKSON --------------------------------------------------------------------------------  Indications:      Pre-CABG. Risk Factors:     Hypertension, hyperlipidemia, past history of smoking,                   coronary artery disease. Other Factors:    CKD. Comparison Study: No previous exams available for review Performing Technologist: Hill, Jody RVT, RDMS  Examination Guidelines: A complete evaluation includes B-mode imaging, spectral Doppler, color Doppler, and power Doppler as needed of all accessible portions of each vessel. Bilateral testing is considered an integral part of a complete examination. Limited examinations for reoccurring indications may be performed as noted.  Right Carotid Findings: +----------+--------+--------+--------+------------------+------------------+           PSV cm/sEDV cm/sStenosisDescribe          Comments           +----------+--------+--------+--------+------------------+------------------+ CCA Prox  95      11  intimal thickening +----------+--------+--------+--------+------------------+------------------+ CCA Distal80      13              calcific and focalintimal thickening  +----------+--------+--------+--------+------------------+------------------+ ICA Prox  115     28      1-39%   heterogenous                         +----------+--------+--------+--------+------------------+------------------+ ICA Distal115     21                                                   +----------+--------+--------+--------+------------------+------------------+ ECA       129     0                                                    +----------+--------+--------+--------+------------------+------------------+ +----------+--------+-------+-------------------------------------+------------+           PSV cm/sEDV cmsDescribe                             Arm Pressure +----------+--------+-------+-------------------------------------+------------+ Subclavian213            Elevated velocities without evidence                                       of stenosis                                       +----------+--------+-------+-------------------------------------+------------+ +---------+--------+--+--------+-+----------------------------+ VertebralPSV cm/s22EDV cm/s0Antegrade and High resistant +---------+--------+--+--------+-+----------------------------+ Left Carotid Findings: +----------+--------+--------+--------+------------+-------------------+           PSV cm/sEDV cm/sStenosisDescribe    Comments            +----------+--------+--------+--------+------------+-------------------+ CCA Prox  97      13                          intimal thickening  +----------+--------+--------+--------+------------+-------------------+ CCA Distal92      17                          intimal thickening  +----------+--------+--------+--------+------------+-------------------+ ICA Prox  85      20      1-39%   heterogenous                    +----------+--------+--------+--------+------------+-------------------+ ICA Distal58      12                           not well visualized +----------+--------+--------+--------+------------+-------------------+ ECA       226     0       >50%    calcific    calcific shadowing  +----------+--------+--------+--------+------------+-------------------+ +----------+--------+--------+----------------+------------+ SubclavianPSV cm/sEDV cm/sDescribe        Arm Pressure +----------+--------+--------+----------------+------------+           123             Multiphasic, WNL             +----------+--------+--------+----------------+------------+ +---------+--------+--+--------+--+---------+  VertebralPSV cm/s44EDV cm/s12Antegrade +---------+--------+--+--------+--+---------+  ABI Findings: +--------+------------------+-----+---------+--------+ Right   Rt Pressure (mmHg)IndexWaveform Comment  +--------+------------------+-----+---------+--------+ [id number]                    triphasic         +--------+------------------+-----+---------+--------+ PTA     189               1.26 biphasic          +--------+------------------+-----+---------+--------+ DP      163               1.09 biphasic          +--------+------------------+-----+---------+--------+ +--------+------------------+-----+----------+-------+ Left    Lt Pressure (mmHg)IndexWaveform  Comment +--------+------------------+-----+----------+-------+ Brachial150                    triphasic         +--------+------------------+-----+----------+-------+ PTA     162               1.08 monophasic        +--------+------------------+-----+----------+-------+ DP      169               1.13 biphasic          +--------+------------------+-----+----------+-------+ +-------+---------------+----------------+ ABI/TBIToday's ABI/TBIPrevious ABI/TBI +-------+---------------+----------------+ Right  1.26                            +-------+---------------+----------------+ Left   1.13                             +-------+---------------+----------------+  Right Doppler Findings: +--------+--------+-----+---------+--------+ Site    PressureIndexDoppler  Comments +--------+--------+-----+---------+--------+ [id number]          triphasic         +--------+--------+-----+---------+--------+ Radial               triphasic         +--------+--------+-----+---------+--------+ Ulnar                biphasic          +--------+--------+-----+---------+--------+  Left Doppler Findings: +--------+--------+-----+---------+--------+ Site    PressureIndexDoppler  Comments +--------+--------+-----+---------+--------+ Brachial150          triphasic         +--------+--------+-----+---------+--------+ Radial               triphasic         +--------+--------+-----+---------+--------+ Ulnar                triphasic         +--------+--------+-----+---------+--------+  Summary: Right Carotid: Velocities in the right ICA are consistent with a 1-39% stenosis.                The extracranial vessels were near-normal with only minimal wall                thickening or plaque. Left Carotid: Velocities in the left ICA are consistent with a 1-39% stenosis.               The ECA appears >50% stenosed. Vertebrals:  Bilateral vertebral arteries demonstrate antegrade flow. Right              vertebral artery demonstrates high resistant flow. Subclavians: Normal flow hemodynamics were seen in the left subclavian artery.              Right subclavian artery shows elevated  velocities without evidence              of stenosis. Right ABI: Resting right ankle-brachial index is within normal range. No evidence of significant right lower extremity arterial disease. Left ABI: Resting left ankle-brachial index is within normal range. No evidence of significant left lower extremity arterial disease. Right Upper Extremity: Abnormal PPG waveforms with radial artery compression suggest an incomplete palmar arch.  Doppler waveform obliterate with right radial compression. Doppler waveforms remain within normal limits with right ulnar compression. Left Upper Extremity: Normal PPG waveforms with radial artery compression suggest palmar arch patency. Doppler waveforms remain within normal limits with left radial compression. Doppler waveforms remain within normal limits with left ulnar compression.  Electronically signed by Harold Barban MD on 03/28/2021 at 8:12:32 PM.    Final    VAS [place] LOWER EXTREMITY VENOUS (DVT)  Result Date: 03/28/2021  Lower Venous DVT Study Patient Name:  JEFERSON REYMOND  Date of Exam:   03/28/2021 Medical Rec #: [id number]        Accession #:    [id number] Date of Birth: 1935-11-30        Patient Gender: M Patient Age:   32 years Exam Location:  [facility] Procedure:      VAS [place] LOWER EXTREMITY VENOUS (DVT) Referring Phys: Remo Lipps HENDRICKSON --------------------------------------------------------------------------------  Indications: Pain in left leg for a few months (dull/achy).  Comparison Study: No previous exams Performing Technologist: Jody Hill RVT, RDMS  Examination Guidelines: A complete evaluation includes B-mode imaging, spectral Doppler, color Doppler, and power Doppler as needed of all accessible portions of each vessel. Bilateral testing is considered an integral part of a complete examination. Limited examinations for reoccurring indications may be performed as noted. The reflux portion of the exam is performed with the patient in reverse Trendelenburg.  +-----+---------------+---------+-----------+----------+--------------+ RIGHTCompressibilityPhasicitySpontaneityPropertiesThrombus Aging +-----+---------------+---------+-----------+----------+--------------+ CFV  Full           Yes      Yes                                 +-----+---------------+---------+-----------+----------+--------------+    +---------+---------------+---------+-----------+----------+--------------+ LEFT     CompressibilityPhasicitySpontaneityPropertiesThrombus Aging +---------+---------------+---------+-----------+----------+--------------+ CFV      Full           Yes      Yes                                 +---------+---------------+---------+-----------+----------+--------------+ SFJ      Full                                                        +---------+---------------+---------+-----------+----------+--------------+ FV Prox  Full           Yes      Yes                                 +---------+---------------+---------+-----------+----------+--------------+ FV Mid   Full           Yes      Yes                                 +---------+---------------+---------+-----------+----------+--------------+  FV DistalFull           Yes      Yes                                 +---------+---------------+---------+-----------+----------+--------------+ PFV      Full                                                        +---------+---------------+---------+-----------+----------+--------------+ POP      Full           Yes      Yes                                 +---------+---------------+---------+-----------+----------+--------------+ PTV      Full                                                        +---------+---------------+---------+-----------+----------+--------------+ PERO     Full                                                        +---------+---------------+---------+-----------+----------+--------------+     *See table(s) above for measurements and observations. Electronically signed by Harold Barban MD on 03/28/2021 at 8:12:07 PM.    Final      Patient Profile     85 y.o. male with left main and multivessel coronary artery disease, status post CABG 03/15/2021  Assessment & Plan    1.  Unstable angina: Postoperative day #1 from CABG. 2.  Mixed  hyperlipidemia, statin intolerant.  Patient willing to try rosuvastatin 10 mg daily. 3.  Hypertension: Amlodipine started, plans noted to wean off nitroglycerin 4.  Friction rub/mild diffuse ST elevation on EKG: Postop pericarditis, patient currently without symptoms.  Overall doing very well postoperative day #1 from CABG      For questions or updates, please contact [place] Please consult [email or web address] for contact info under        Signed, Sherren Mocha, MD  03/12/2021, 11:16 AM

## 2021-03-30 NOTE — Plan of Care (Signed)
  Problem: Education: Goal: Knowledge of General Education information will improve Description: Including pain rating scale, medication(s)/side effects and non-pharmacologic comfort measures Outcome: Progressing   Problem: Health Behavior/Discharge Planning: Goal: Ability to manage health-related needs will improve Outcome: Progressing   Problem: Clinical Measurements: Goal: Ability to maintain clinical measurements within normal limits will improve Outcome: Progressing Goal: Will remain free from infection Outcome: Progressing Goal: Diagnostic test results will improve Outcome: Progressing Goal: Respiratory complications will improve Outcome: Progressing Goal: Cardiovascular complication will be avoided Outcome: Progressing   Problem: Activity: Goal: Risk for activity intolerance will decrease Outcome: Progressing   Problem: Nutrition: Goal: Adequate nutrition will be maintained Outcome: Progressing   Problem: Coping: Goal: Level of anxiety will decrease Outcome: Progressing   Problem: Elimination: Goal: Will not experience complications related to bowel motility Outcome: Progressing Goal: Will not experience complications related to urinary retention Outcome: Progressing   Problem: Pain Managment: Goal: General experience of comfort will improve Outcome: Progressing   Problem: Safety: Goal: Ability to remain free from injury will improve Outcome: Progressing   Problem: Skin Integrity: Goal: Risk for impaired skin integrity will decrease Outcome: Progressing   Problem: Education: Goal: Knowledge of disease or condition will improve Outcome: Progressing Goal: Understanding of medication regimen will improve Outcome: Progressing Goal: Individualized Educational Video(s) Outcome: Progressing   Problem: Activity: Goal: Ability to tolerate increased activity will improve Outcome: Progressing   Problem: Cardiac: Goal: Ability to achieve and maintain  adequate cardiopulmonary perfusion will improve Outcome: Progressing   Problem: Health Behavior/Discharge Planning: Goal: Ability to safely manage health-related needs after discharge will improve Outcome: Progressing   Problem: Education: Goal: Will demonstrate proper wound care and an understanding of methods to prevent future damage Outcome: Progressing Goal: Knowledge of disease or condition will improve Outcome: Progressing Goal: Knowledge of the prescribed therapeutic regimen will improve Outcome: Progressing Goal: Individualized Educational Video(s) Outcome: Progressing   Problem: Activity: Goal: Risk for activity intolerance will decrease Outcome: Progressing   Problem: Cardiac: Goal: Will achieve and/or maintain hemodynamic stability Outcome: Progressing   Problem: Clinical Measurements: Goal: Postoperative complications will be avoided or minimized Outcome: Progressing   Problem: Respiratory: Goal: Respiratory status will improve Outcome: Progressing   Problem: Skin Integrity: Goal: Wound healing without signs and symptoms of infection Outcome: Progressing Goal: Risk for impaired skin integrity will decrease Outcome: Progressing   Problem: Urinary Elimination: Goal: Ability to achieve and maintain adequate renal perfusion and functioning will improve Outcome: Progressing   

## 2021-03-30 NOTE — Hospital Course (Addendum)
History of Present Illness:    At time of consultation. [name] is a very pleasant 85 year old male with past history significant for hypertension, dyslipidemia, stage III chronic kidney disease.  He has been having some exertional chest discomfort and tightness for about a month.  He had a more intense episode of chest pain all the way across his chest and radiating down both arms this past Saturday, 03/24/2021.  He contacted his primary care physician was advised to proceed to the [facility].  He was evaluated there with an EKG that showed sinus rhythm and no ischemic changes.  Chest x-ray was unremarkable.  High-sensitivity troponin level was also normal.  The BNP was 90.  He was hypertensive with a systolic blood pressure in the 180s.  Transfer to [facility] for further evaluation was recommended.  He remained stable following transfer.  He was admitted by the cardiology service.  Left heart catheterization was recommended and carried out earlier today.  This demonstrates three-vessel coronary artery disease with preserved LV function.  There is a 70% proximal RCA stenosis followed by high-grade stenoses in the distal RCA.  There was a 70% left main coronary artery stenosis followed by 75% mid LAD stenosis and 75% proximal circumflex stenosis.  CT surgery has been asked to evaluate Steven Valenzuela for consideration of operative revascularization.   Currently, Steven Valenzuela is resting in bed with his family at the bedside.  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  He reports he has been healthy overall.  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  He has never had any type of surgery.  He does report having some superficial varicosities in his right medial thigh and in his left lower leg.  The patient and all relevant studies were reviewed by Dr.  Roxan Hockey who recommended proceeding with coronary artery surgical revascularization as his best option due to the severity of the anatomical findings and nature of his symptoms.  Hospital course:  Patient was felt to be medically stable to proceed with CABG on 03/31/2021.  He was taken the operating room where he underwent CABG x4 by Dr. Roxan Hockey.  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  Overall he tolerated the procedure very well and was taken to the surgical intensive care unit in stable condition.  Postoperative hospital course:  On postoperative day 1 he was felt to be doing quite well.  He was extubated using standard post cardiac surgical protocols without difficulty.  He is started on a course of routine pulmonary toilet for postoperative atelectasis.  He did have some postoperative hypertension requiring nitroglycerin drip and he has been transitioned to beta-blocker and amlodipine.  His cardiac indices as well as EKG were felt to be stable in appearance with the exception of some findings consistent with possible pericarditis.  His blood sugars have been under adequate control using standard measures.  He does have a expected acute blood loss anemia and hemoglobin hematocrit are being trended.  He is not currently in the transfusion threshold.  Renal function has remained within normal limits.  He does have an expected postoperative volume overload and will be given a course of diuretics.  All  routine lines, monitors drainage devices have been discontinued in standard fashion.

## 2021-03-30 NOTE — Progress Notes (Signed)
Patient ID: Steven Valenzuela, male   DOB: 01/28/1936, 85 y.o.   MRN: [id number] TCTS Evening Rounds:  Hemodynamically stable in sinus rhythm.  Diuresing well today.  Ambulated.  BMET    Component Value Date/Time   NA 134 (L) 03/22/2021 1705   K 4.0 03/11/2021 1705   CL 102 03/06/2021 1705   CO2 24 03/17/2021 1705   GLUCOSE 138 (H) 03/11/2021 1705   BUN 21 03/07/2021 1705   CREATININE 1.50 (H) 03/10/2021 1705   CALCIUM 8.4 (L) 03/24/2021 1705   GFRNONAA 46 (L) 03/08/2021 1705   CBC    Component Value Date/Time   WBC 11.1 (H) 03/12/2021 1705   RBC 2.96 (L) 03/11/2021 1705   HGB 9.4 (L) 03/08/2021 1705   HCT 27.8 (L) 03/17/2021 1705   PLT 120 (L) 03/08/2021 1705   MCV 93.9 03/05/2021 1705   MCH 31.8 03/13/2021 1705   MCHC 33.8 03/13/2021 1705   RDW 14.1 04/03/2021 1705   LYMPHSABS 1.8 03/31/2021 1132   MONOABS 0.6 03/25/2021 1132   EOSABS 0.1 03/19/2021 1132   BASOSABS 0.0 03/16/2021 1132

## 2021-03-31 ENCOUNTER — Inpatient Hospital Stay (HOSPITAL_COMMUNITY): Payer: PPO

## 2021-03-31 DIAGNOSIS — Z951 Presence of aortocoronary bypass graft: Secondary | ICD-10-CM | POA: Diagnosis not present

## 2021-03-31 LAB — BASIC METABOLIC PANEL
Anion gap: 6 (ref 5–15)
BUN: 22 mg/dL (ref 8–23)
CO2: 27 mmol/L (ref 22–32)
Calcium: 8.4 mg/dL — ABNORMAL LOW (ref 8.9–10.3)
Chloride: 102 mmol/L (ref 98–111)
Creatinine, Ser: 1.33 mg/dL — ABNORMAL HIGH (ref 0.61–1.24)
GFR, Estimated: 53 mL/min — ABNORMAL LOW (ref >60)
Glucose, Bld: 122 mg/dL — ABNORMAL HIGH (ref 70–99)
Potassium: 3.8 mmol/L (ref 3.5–5.1)
Sodium: 135 mmol/L (ref 135–145)

## 2021-03-31 LAB — CBC
HCT: 26 % — ABNORMAL LOW (ref 39.0–52.0)
Hemoglobin: 8.5 g/dL — ABNORMAL LOW (ref 13.0–17.0)
MCH: 31 pg (ref 26.0–34.0)
MCHC: 32.7 g/dL (ref 30.0–36.0)
MCV: 94.9 fL (ref 80.0–100.0)
Platelets: 97 10*3/uL — ABNORMAL LOW (ref 150–400)
RBC: 2.74 MIL/uL — ABNORMAL LOW (ref 4.22–5.81)
RDW: 14.1 % (ref 11.5–15.5)
WBC: 8.3 10*3/uL (ref 4.0–10.5)
nRBC: 0 % (ref 0.0–0.2)

## 2021-03-31 LAB — GLUCOSE, CAPILLARY
Glucose-Capillary: 114 mg/dL — ABNORMAL HIGH (ref 70–99)
Glucose-Capillary: 110 mg/dL — ABNORMAL HIGH (ref 70–99)

## 2021-03-31 MED ORDER — SODIUM CHLORIDE 0.9% FLUSH: 3.0000 mL | INTRAVENOUS | Status: DC | PRN

## 2021-03-31 MED ORDER — ~~LOC~~ CARDIAC SURGERY, PATIENT & FAMILY EDUCATION: Freq: Once | Status: DC

## 2021-03-31 MED ORDER — PANTOPRAZOLE SODIUM 40 MG PO TBEC
40.0000 mg | DELAYED_RELEASE_TABLET | Freq: Every day | ORAL | Status: DC
  Administered 2021-04-01 – 2021-04-02 (×2): 40 mg via ORAL
  Filled 2021-03-31 (×2): qty 1

## 2021-03-31 MED ORDER — POTASSIUM CHLORIDE CRYS ER 20 MEQ PO TBCR
20.0000 meq | EXTENDED_RELEASE_TABLET | Freq: Two times a day (BID) | ORAL | Status: DC
Start: 2021-03-31 — End: 2021-04-02
  Administered 2021-03-31 – 2021-04-02 (×5): 20 meq via ORAL
  Filled 2021-03-31 (×5): qty 1

## 2021-03-31 MED ORDER — TRAMADOL HCL 50 MG PO TABS: 50.0000 mg | ORAL_TABLET | ORAL | Status: DC | PRN

## 2021-03-31 MED ORDER — ASPIRIN EC 325 MG PO TBEC
325.0000 mg | DELAYED_RELEASE_TABLET | Freq: Every day | ORAL | Status: DC
  Administered 2021-04-01 – 2021-04-02 (×2): 325 mg via ORAL
  Filled 2021-03-31 (×2): qty 1

## 2021-03-31 MED ORDER — SENNOSIDES-DOCUSATE SODIUM 8.6-50 MG PO TABS
1.0000 | ORAL_TABLET | Freq: Two times a day (BID) | ORAL | Status: DC | PRN
  Administered 2021-03-31 – 2021-04-01 (×2): 1 via ORAL
  Filled 2021-03-31 (×2): qty 1

## 2021-03-31 MED ORDER — CHLORHEXIDINE GLUCONATE CLOTH 2 % EX PADS
6.0000 | MEDICATED_PAD | Freq: Every day | CUTANEOUS | Status: DC
  Administered 2021-04-01: 6 via TOPICAL

## 2021-03-31 MED ORDER — FUROSEMIDE 40 MG PO TABS
40.0000 mg | ORAL_TABLET | Freq: Every day | ORAL | Status: AC
  Administered 2021-03-31 – 2021-04-02 (×3): 40 mg via ORAL
  Filled 2021-03-31 (×3): qty 1

## 2021-03-31 MED ORDER — SODIUM CHLORIDE 0.9 % IV SOLN: 250.0000 mL | INTRAVENOUS | Status: DC | PRN

## 2021-03-31 MED ORDER — METOPROLOL TARTRATE 12.5 MG HALF TABLET
12.5000 mg | ORAL_TABLET | Freq: Two times a day (BID) | ORAL | Status: DC
  Administered 2021-03-31 – 2021-04-01 (×2): 12.5 mg via ORAL
  Filled 2021-03-31 (×4): qty 1

## 2021-03-31 MED ORDER — ONDANSETRON HCL 4 MG/2ML IJ SOLN: 4.0000 mg | Freq: Four times a day (QID) | INTRAMUSCULAR | Status: DC | PRN

## 2021-03-31 MED ORDER — OXYCODONE HCL 5 MG PO TABS
5.0000 mg | ORAL_TABLET | ORAL | Status: DC | PRN
  Administered 2021-04-01: 5 mg via ORAL
  Filled 2021-03-31: qty 1

## 2021-03-31 MED ORDER — SODIUM CHLORIDE 0.9% FLUSH
3.0000 mL | Freq: Two times a day (BID) | INTRAVENOUS | Status: DC
  Administered 2021-03-31 – 2021-04-02 (×4): 3 mL via INTRAVENOUS

## 2021-03-31 MED ORDER — ONDANSETRON HCL 4 MG PO TABS: 4.0000 mg | ORAL_TABLET | Freq: Four times a day (QID) | ORAL | Status: DC | PRN

## 2021-03-31 NOTE — Op Note (Signed)
Steven Valenzuela, Steven Valenzuela MEDICAL RECORD NO: [id number] ACCOUNT NO: [id number] DATE OF BIRTH: [birth year] FACILITY: [facility] LOCATION: [facility] PHYSICIAN: Revonda Standard. Roxan Hockey, MD  Operative Report   DATE OF PROCEDURE: 03/10/2021  PREOPERATIVE DIAGNOSIS:  Three-vessel coronary artery disease.  POSTOPERATIVE DIAGNOSIS:  Three-vessel coronary artery disease.  PROCEDURES PERFORMED:   Median sternotomy, extracorporeal circulation,  Coronary artery bypass grafting x 4  Left internal mammary artery to LAD, Saphenous vein graft to posterior descending, Sequential saphenous vein graft to obtuse marginals 1 and 2 Endoscopic vein harvest left leg and  Biopsy of the left upper lobe.  SURGEON: Modesto Charon, MD  ASSISTANT:  Jadene Pierini, PA-C.  ANESTHESIA:  General.  FINDINGS:  Transesophageal echocardiography revealed mild mitral regurgitation and preserved left ventricular function. Diffusely diseased fair quality target vessels, OM2 poor quality, posterolateral too small to graft.  Unusual nodular appearance of  lung, biopsy taken from left upper lobe for permanent pathology.  CLINICAL NOTE:  Steven Valenzuela is an 85 year old man who presented with exertional chest discomfort and had a severe episode that brought him to the hospital.  He ruled out for MI, but at catheterization, he had severe 3-vessel coronary disease and left  main stenosis of 70%.  He was advised to undergo coronary artery bypass grafting.  The indications, risks, benefits, and alternatives were discussed in detail with the patient.  He understood and accepted the risks and agreed to proceed.  OPERATIVE NOTE:  Steven Valenzuela was brought to the preoperative holding area on 03/20/2021.  Anesthesia placed a Swan-Ganz catheter and an arterial blood pressure monitoring line.  He was taken to the operating room, anesthetized and intubated.  Intravenous  antibiotics were administered.  A transesophageal echocardiogram was performed.  Findings  as noted above. Please see Dr. Conrad Van Buren separately dictated note for full details.  The chest, abdomen and legs were prepped and draped in the usual sterile  fashion.  A median sternotomy was performed and the left internal mammary artery was harvested using standard technique.  Simultaneously, incision was made in the medial aspect of the right leg.  The greater saphenous vein was harvested from the upper calf to the  groin endoscopically.  The mammary artery was a good quality vessel.  The vein was relatively large, but otherwise good quality except for a couple of areas that had varicosities that had to be excised and repaired with end-to-end anastomoses.  There was  sufficient vein for the bypasses. 2000 units of heparin was administered during the vessel harvest.  After harvesting the conduits, the remainder of the full heparin dose was given.  A sternal retractor was placed and it was opened over time.  The pericardium was opened.  The ascending aorta was inspected.  It was of normal caliber with no evidence of atherosclerotic disease.  After confirming adequate anticoagulation with ACT  measurement the aorta was cannulated via concentric 2-0 Ethibond pledgeted pursestring sutures.  A dual stage venous cannula was placed via a pursestring suture in the right atrial appendage.  Cardiopulmonary bypass was initiated.  Flows were maintained  per protocol.  The patient was cooled to 32 degrees Celsius.  The coronary arteries were inspected and anastomotic sites were chosen.  The conduits were inspected and cut to length.  A foam pad was placed in the pericardium to insulate the heart.  A  temperature probe was placed in the myocardial septum and a cardioplegia cannula was placed in the ascending aorta.  Of note, the posterolateral vessel appeared too small to  graft.  The OM2 was a borderline vessel, but was felt potentially graftable.  The aorta was cross clamped.  The left ventricle was emptied via the  aortic root vent. Cardiac arrest then was achieved with a combination of cold antegrade blood cardioplegia and topical iced saline.  There was a rapid diastolic arrest and there was  septal cooling to 10 degrees Celsius.  A reversed saphenous vein graft was placed end-to-side to the posterior descending.  This vessel was fair quality.  It did have disease distal to the anastomosis, but a 1.5 mm probe did pass to the apex.  Proximally, there was significant calcification.   The vein was relatively large, but otherwise good quality.  It was anastomosed end-to-side with a running 7-0 Prolene suture.  All anastomoses were probed proximally and distally at their completion.  Cardioplegia was administered down the graft.  There  was good flow and good hemostasis.  Additional cardioplegia was also administered via the aortic root.  Next, the heart was elevated.  A sequential saphenous vein graft was placed to obtuse marginals 1 and 2. Obtuse marginal 1 was a high anterolateral branch and was a 1.5 mm fair quality target.  It was superficially intramyocardial.  A side-to-side  anastomosis was performed with a running 7-0 Prolene suture and the probe passed easily proximally and distally.  The distal end of the graft then was beveled and was anastomosed end-to-side to the OM2. OM2 accepted a 1.5 mm probe proximally, but only a  1 mm probe distally.  It was a poor quality target vessel.  The end-to-side anastomosis was performed with a running 7-0 Prolene suture.  Cardioplegia was administered down the graft and there was good flow and good hemostasis.  Next, the left internal mammary artery was brought through a window in the pericardium.  The distal end was beveled.  The mammary was a 2 mm good quality conduit.  It was anastomosed end-to-side to the LAD.  There was left main disease and there was a  plaque in the LAD just beyond the takeoff of the second diagonal vessel.  The second diagonal vessel was larger  than the LAD proper, which was actually a very small vessel beyond that bifurcation.  An arteriotomy was made and extended across the stenotic  area in the LAD and incorporating the origin of the second diagonal.  The mammary was anastomosed end-to-side with a running 8-0 Prolene suture.  After completion of the anastomosis, the bulldog clamp was removed from the mammary artery.  Rapid septal  rewarming was noted.  There were good flashes of blood in both the diagonal branch and the LAD proper.  The bulldog clamp was replaced.  The mammary pedicle was tacked to the epicardial surface of the heart with 6-0 Prolene sutures.  Additional cardioplegia was administered.  The vein grafts were cut to length.  The proximal vein graft anastomoses were performed to 4.5 mm punch aortotomies with running 6-0 Prolene sutures.  After completion of the final proximal anastomosis, the  patient was placed in Trendelenburg position.  Lidocaine was administered.  The aortic root was de-aired and the aortic crossclamp was removed.  The total crossclamp time was 88 minutes.  The patient required a single defibrillation with 10 joules and  then was in sinus rhythm thereafter.  A test dose protamine was administered and was well tolerated.  The atrial and aortic cannulae were removed.  The remainder of the protamine was administered without incident.  Post-bypass transesophageal echocardiography  was unchanged from the prebypass  study.  The chest was copiously irrigated with warm saline.  Hemostasis was achieved.  Left pleural and mediastinal chest tubes were placed through separate subcostal incision.  It should be noted that a small piece of the left upper lobe was biopsied  using sequential firings of a Covidien stapler using black cartridges.  The lung had a nodular appearance to it.  This was sent for permanent pathology and a small piece was sent for AFB and fungal cultures.  Left pleural and mediastinal chest tubes were   placed through separate subcostal incisions.  The sternum was closed with a combination of single and double heavy gauge stainless steel wires.  The pectoralis fascia, subcutaneous tissue and skin were closed in standard fashion.  All sponge, needle and  instrument counts were correct at the end of the procedure. The patient was taken from the operating room to the Surgical Intensive Care Unit, intubated and in good condition.     PAA D: 03/21/2021 5:50:51 pm T: 03/31/2021 12:41:00 am  JOB: [id number]

## 2021-03-31 NOTE — Progress Notes (Signed)
2 Days Post-Op Procedure(s) (LRB): CORONARY ARTERY BYPASS GRAFTING (CABG), ON PUMP, TIMES FOUR, USING LEFT INTERNAL MAMMARY ARTERY AND RIGHT ENDOSCOPICALLY HARVESTED GREATER SAPHENOUS VEIN (N/A) TRANSESOPHAGEAL ECHOCARDIOGRAM (TEE) (N/A) Subjective: No complaints. Ambulating.  Objective: Vital signs in last 24 hours: Temp:  [97.7 F (36.5 C)-98.9 F (37.2 C)] 98.7 F (37.1 C) (08/27 0619) Pulse Rate:  [69-97] 97 (08/27 0700) Cardiac Rhythm: Normal sinus rhythm;Heart block (08/26 1945) Resp:  [15-25] 20 (08/27 0700) BP: (105-126)/(59-74) 126/74 (08/27 0700) SpO2:  [90 %-100 %] 95 % (08/27 0700) Arterial Line BP: (115-146)/(44-59) 133/51 (08/26 1300)  Hemodynamic parameters for last 24 hours: PAP: (15-17)/(7) 17/7  Intake/Output from previous day: 08/26 0701 - 08/27 0700 In: 668.6 [P.O.:360; I.V.:208.6; IV Piggyback:100] Out: 1660 [Urine:1620; Chest Tube:40] Intake/Output this shift: No intake/output data recorded.  General appearance: alert and cooperative Neurologic: intact Heart: regular rate and rhythm Lungs: diminished breath sounds bibasilar Extremities: edema mild Wound: dressing dry  Lab Results: Recent Labs    04/01/2021 1705 03/31/21 0448  WBC 11.1* 8.3  HGB 9.4* 8.5*  HCT 27.8* 26.0*  PLT 120* 97*   BMET:  Recent Labs    04/04/2021 1705 03/31/21 0448  NA 134* 135  K 4.0 3.8  CL 102 102  CO2 24 27  GLUCOSE 138* 122*  BUN 21 22  CREATININE 1.50* 1.33*  CALCIUM 8.4* 8.4*    PT/INR:  Recent Labs    03/15/2021 1830  LABPROT 16.2*  INR 1.3*   ABG    Component Value Date/Time   PHART 7.299 (L) 03/18/2021 0010   HCO3 21.2 03/12/2021 0010   TCO2 23 03/05/2021 0010   ACIDBASEDEF 5.0 (H) 03/10/2021 0010   O2SAT 98.0 04/03/2021 0010   CBG (last 3)  Recent Labs    03/08/2021 1525 03/07/2021 2220 03/31/21 0617  GLUCAP 152* 133* 114*   CXR: bibasilar atelectasis.  Assessment/Plan: S/P Procedure(s) (LRB): CORONARY ARTERY BYPASS GRAFTING (CABG),  ON PUMP, TIMES FOUR, USING LEFT INTERNAL MAMMARY ARTERY AND RIGHT ENDOSCOPICALLY HARVESTED GREATER SAPHENOUS VEIN (N/A) TRANSESOPHAGEAL ECHOCARDIOGRAM (TEE) (N/A)  POD 2 Hemodynamically stable in sinus rhythm. Continue Lopressor.  Volume excess: -1L yesterday. Wt is probably 6-10 lbs over preop by weight. Continue diuresis.  Glucose under good control and no hx of DM. Stop CBG's  DC neck sleeve, foley.  Transfer to 4E and continue IS, ambulation.   LOS: 4 days    Gaye Pollack 03/31/2021

## 2021-03-31 NOTE — Progress Notes (Signed)
Progress Note  Patient Name: Steven Valenzuela Date of Encounter: 03/31/2021  [facility] Cardiologist: None   Subjective   No CP.  Feels a bit nauseous otherwise well.  No CP, SOB.  Inpatient Medications    Scheduled Meds:  acetaminophen  1,000 mg Oral Q6H   Or   acetaminophen (TYLENOL) oral liquid 160 mg/5 mL  1,000 mg Per Tube Q6H   amLODipine  10 mg Oral Daily   aspirin EC  325 mg Oral Daily   Or   aspirin  324 mg Per Tube Daily   bisacodyl  10 mg Oral Daily   Or   bisacodyl  10 mg Rectal Daily   Chlorhexidine Gluconate Cloth  6 each Topical Daily   docusate sodium  200 mg Oral Daily   enoxaparin (LOVENOX) injection  40 mg Subcutaneous QHS   insulin aspart  0-24 Units Subcutaneous TID AC & HS   levothyroxine  75 mcg Oral QAC breakfast   mouth rinse  15 mL Mouth Rinse BID   metoprolol tartrate  12.5 mg Oral BID   Or   metoprolol tartrate  12.5 mg Per Tube BID   pantoprazole  40 mg Oral Daily   rosuvastatin  10 mg Oral Daily   sodium chloride flush  3 mL Intravenous Q12H   Continuous Infusions:  sodium chloride Stopped (03/14/2021 0800)    ceFAZolin (ANCEF) IV 2 g (03/31/21 0526)   lactated ringers Stopped (03/21/2021 1415)   nitroGLYCERIN Stopped (03/25/2021 1017)   phenylephrine (NEO-SYNEPHRINE) Adult infusion Stopped (03/06/2021 1951)   PRN Meds: metoprolol tartrate, midazolam, morphine injection, ondansetron (ZOFRAN) IV, oxyCODONE, sodium chloride flush, traMADol   Vital Signs    Vitals:   03/31/21 0400 03/31/21 0500 03/31/21 0619 03/31/21 0700  BP: 116/60 108/68  126/74  Pulse: 78 79 81 97  Resp: _0 Temp: 98.2 F (36.8 C)  98.7 F (37.1 C)   TempSrc: Oral  Oral   SpO2: 96% 95% 95% 95%  Weight:      Height:        Intake/Output Summary (Last 24 hours) at 03/31/2021 0848 Last data filed at 03/31/2021 0500 Gross per 24 hour  Intake 624.52 ml  Output 1625 ml  Net -1000.48 ml   Last 3 Weights 03/06/2021 03/15/2021 03/28/2021  Weight (lbs) 186  lb 4.6 oz 180 lb 386 lb 14.5 oz  Weight (kg) 84.5 kg 81.647 kg 175.5 kg      Telemetry    SR with PACs - Personally Reviewed  ECG    No new tracing   Physical Exam   GEN: No acute distress.   Neck: No JVD Cardiac: RRR, no murmurs, or gallops. Subtle friction rub. Respiratory: Clear to auscultation bilaterally. GI: Soft, nontender, non-distended  MS: No edema; No deformity. Right radial cath site stable. Neuro:  Nonfocal  Psych: Normal affect   Labs    High Sensitivity Troponin:   Recent Labs  Lab 03/09/2021 1132 03/08/2021 1353  TROPONINIHS 8 10      Chemistry Recent Labs  Lab 04/04/2021 1132 03/16/2021 1941 03/12/2021 0321 03/18/2021 1705 03/31/21 0448  NA 138   < > 137 134* 135  K 4.5   < > 4.5 4.0 3.8  CL 102   < > 108 102 102  CO2 26   < > _1 GLUCOSE 93   < > 152* 138* 122*  BUN 18   < > _2 CREATININE 1.20   < >  1.11 1.50* 1.33*  CALCIUM 9.1   < > 8.3* 8.4* 8.4*  PROT 7.0  --   --   --   --   ALBUMIN 3.8  --   --   --   --   AST 20  --   --   --   --   ALT 13  --   --   --   --   ALKPHOS 57  --   --   --   --   BILITOT 0.5  --   --   --   --   GFRNONAA >60   < > >60 46* 53*  ANIONGAP 10   < > _0 < > = values in this interval not displayed.     Hematology Recent Labs  Lab 03/18/2021 0321 03/05/2021 1705 03/31/21 0448  WBC 7.0 11.1* 8.3  RBC 2.97* 2.96* 2.74*  HGB 9.2* 9.4* 8.5*  HCT 27.8* 27.8* 26.0*  MCV 93.6 93.9 94.9  MCH 31.0 31.8 31.0  MCHC 33.1 33.8 32.7  RDW 13.5 14.1 14.1  PLT 103* 120* 97*    BNP Recent Labs  Lab 03/08/2021 1132  BNP 90.0     DDimer No results for input(s): DDIMER in the last 168 hours.   Radiology    DG Chest Port 1 View  Result Date: 03/31/2021 CLINICAL DATA:  Chest pain. EXAM: PORTABLE CHEST 1 VIEW COMPARISON:  March 30, 2021. FINDINGS: Stable cardiomediastinal silhouette. Status post coronary bypass graft. Left-sided chest tube has been removed without pneumothorax. Bibasilar atelectasis is  noted with probable small pleural effusions. Bony thorax is unremarkable. IMPRESSION: Status post left-sided chest tube removal without definite pneumothorax. Bibasilar atelectasis. Electronically Signed   By: Marijo Conception M.D.   On: 03/31/2021 08:08   DG Chest Port 1 View  Result Date: 03/21/2021 CLINICAL DATA:  Status post cardiac surgery. EXAM: PORTABLE CHEST 1 VIEW COMPARISON:  March 29, 2021. FINDINGS: Stable cardiomediastinal silhouette. Endotracheal and nasogastric tubes have been removed. Right internal jugular Swan-Ganz catheter is unchanged in position. Stable position of left-sided chest tube is noted with small left apical pneumothorax. Hypoinflation of the lungs is noted with mild bibasilar subsegmental atelectasis. Bony thorax is unremarkable. IMPRESSION: Endotracheal and nasogastric tubes have been removed. Stable position of left-sided chest tube is noted with small left apical pneumothorax. Hypoinflation of the lungs is noted with mild bibasilar subsegmental atelectasis. Aortic Atherosclerosis (ICD10-I70.0). Electronically Signed   By: Marijo Conception M.D.   On: 03/12/2021 08:48   DG Chest Port 1 View  Result Date: 03/06/2021 CLINICAL DATA:  Chest pain, shortness of breath, unresponsive EXAM: PORTABLE CHEST 1 VIEW COMPARISON:  04/04/2021 FINDINGS: Endotracheal tube is 9 cm above the carina. Swan-Ganz catheter tip in the main pulmonary artery. NG tube is in the stomach. Left chest tube in place. Tiny left apical pneumothorax. Changes of CABG. No confluent opacity or effusions. IMPRESSION: Postoperative changes.  Support devices in expected position. Tiny left apical pneumothorax. Electronically Signed   By: Rolm Baptise M.D.   On: 03/24/2021 18:30    Cardiac Studies   Cath: 03/17/2021   Ost RCA to Prox RCA lesion is 70% stenosed.   Dist RCA-1 lesion is 90% stenosed.   Dist RCA-2 lesion is 75% stenosed.   Prox Cx lesion is 75% stenosed.   1st Mrg lesion is 80% stenosed.   Ost LM to  Mid LM lesion is 70% stenosed.   Mid LAD lesion  is 75% stenosed.   Prox LAD lesion is 50% stenosed.   The left ventricular systolic function is normal.   LV end diastolic pressure is normal.   The left ventricular ejection fraction is 55-65% by visual estimate.   There is no aortic valve stenosis.   Severe multivessel CAD including ostial LAD disease that causes significant pressure dampening.    Plan for cardiac surgery consult.   Diagnostic Dominance: Right  Echo: 03/18/2021  IMPRESSIONS    1. Left ventricular ejection fraction, by estimation, is 60 to 65%. The  left ventricle has normal function. The left ventricle has no regional  wall motion abnormalities. There is mild left ventricular hypertrophy.  Left ventricular diastolic parameters  are consistent with Grade II diastolic dysfunction (pseudonormalization).  Elevated left ventricular end-diastolic pressure. The E/e' is 23.   2. Right ventricular systolic function is normal. The right ventricular  size is normal. There is normal pulmonary artery systolic pressure. The  estimated right ventricular systolic pressure is 40.0 mmHg.   3. The mitral valve is degenerative. Mild mitral valve regurgitation. No  evidence of mitral stenosis. Mild-moderate MAC.   4. The aortic valve is grossly normal. There is mild calcification of the  aortic valve. Aortic valve regurgitation is not visualized. No aortic  stenosis is present.   5. The inferior vena cava is normal in size with greater than 50%  respiratory variability, suggesting right atrial pressure of 3 mmHg.   FINDINGS   Left Ventricle: Left ventricular ejection fraction, by estimation, is 60  to 65%. The left ventricle has normal function. The left ventricle has no  regional wall motion abnormalities. The left ventricular internal cavity  size was normal in size. There is   mild left ventricular hypertrophy. Left ventricular diastolic parameters  are consistent with Grade II  diastolic dysfunction (pseudonormalization).  Elevated left ventricular end-diastolic pressure. The E/e' is 60.   Right Ventricle: The right ventricular size is normal. No increase in  right ventricular wall thickness. Right ventricular systolic function is  normal. There is normal pulmonary artery systolic pressure. The tricuspid  regurgitant velocity is 2.78 m/s, and   with an assumed right atrial pressure of 3 mmHg, the estimated right  ventricular systolic pressure is 86.7 mmHg.   Left Atrium: Left atrial size was normal in size.   Right Atrium: Right atrial size was normal in size.   Pericardium: There is no evidence of pericardial effusion.   Mitral Valve: The mitral valve is degenerative in appearance. Mild to  moderate mitral annular calcification. Mild mitral valve regurgitation. No  evidence of mitral valve stenosis.   Tricuspid Valve: The tricuspid valve is normal in structure. Tricuspid  valve regurgitation is mild . No evidence of tricuspid stenosis.   Aortic Valve: The aortic valve is grossly normal. There is mild  calcification of the aortic valve. Aortic valve regurgitation is not  visualized. No aortic stenosis is present.   Pulmonic Valve: The pulmonic valve was normal in structure. Pulmonic valve  regurgitation is mild. No evidence of pulmonic stenosis.   Aorta: The aortic root is normal in size and structure.   Venous: The inferior vena cava is normal in size with greater than 50%  respiratory variability, suggesting right atrial pressure of 3 mmHg.   IAS/Shunts: No atrial level shunt detected by color flow Doppler.       Patient Profile     85 y.o. male  with newly diagnosed severe multivessel coronary artery disease going for  planned CABG 03/11/2021 per Dr. Roxan Hockey  Assessment & Plan    CAD s/p CABG HTN -- remains on ASA, low dose statin  -- Friction rub without pain,  - norvasc 10; on metoprolol per CT surg protocol  HLD: has been  intolerant to statins in the past. Willing to try low dose statin which has been started -- LDL 142 -- on Crestor 36m daily     For questions or updates, please contact [facility] Please consult [email or web address] for contact info under        Signed, MWerner Lean MD  03/31/2021, 8:48 AM

## 2021-04-01 LAB — CBC
HCT: 24.6 % — ABNORMAL LOW (ref 39.0–52.0)
Hemoglobin: 8.4 g/dL — ABNORMAL LOW (ref 13.0–17.0)
MCH: 31.9 pg (ref 26.0–34.0)
MCHC: 34.1 g/dL (ref 30.0–36.0)
MCV: 93.5 fL (ref 80.0–100.0)
Platelets: 113 10*3/uL — ABNORMAL LOW (ref 150–400)
RBC: 2.63 MIL/uL — ABNORMAL LOW (ref 4.22–5.81)
RDW: 14 % (ref 11.5–15.5)
WBC: 8.9 10*3/uL (ref 4.0–10.5)
nRBC: 0.2 % (ref 0.0–0.2)

## 2021-04-01 LAB — BASIC METABOLIC PANEL
Anion gap: 6 (ref 5–15)
BUN: 26 mg/dL — ABNORMAL HIGH (ref 8–23)
CO2: 24 mmol/L (ref 22–32)
Calcium: 8.2 mg/dL — ABNORMAL LOW (ref 8.9–10.3)
Chloride: 103 mmol/L (ref 98–111)
Creatinine, Ser: 1.37 mg/dL — ABNORMAL HIGH (ref 0.61–1.24)
GFR, Estimated: 52 mL/min — ABNORMAL LOW (ref >60)
Glucose, Bld: 109 mg/dL — ABNORMAL HIGH (ref 70–99)
Potassium: 4 mmol/L (ref 3.5–5.1)
Sodium: 133 mmol/L — ABNORMAL LOW (ref 135–145)

## 2021-04-01 MED ORDER — METOPROLOL TARTRATE 25 MG PO TABS: 25.0000 mg | ORAL_TABLET | Freq: Two times a day (BID) | ORAL | Status: DC

## 2021-04-01 MED ORDER — FE FUMARATE-B12-VIT C-FA-IFC PO CAPS
1.0000 | ORAL_CAPSULE | Freq: Two times a day (BID) | ORAL | Status: DC
  Administered 2021-04-01 – 2021-04-02 (×2): 1 via ORAL
  Filled 2021-04-01 (×2): qty 1

## 2021-04-01 MED ORDER — METOPROLOL TARTRATE 25 MG PO TABS
25.0000 mg | ORAL_TABLET | Freq: Two times a day (BID) | ORAL | Status: DC
  Administered 2021-04-01: 25 mg via ORAL
  Administered 2021-04-01: 12.5 mg via ORAL
  Administered 2021-04-02: 25 mg via ORAL
  Filled 2021-04-01 (×2): qty 1

## 2021-04-01 NOTE — Plan of Care (Signed)
  Problem: Education: Goal: Knowledge of General Education information will improve Description: Including pain rating scale, medication(s)/side effects and non-pharmacologic comfort measures Outcome: Progressing   Problem: Health Behavior/Discharge Planning: Goal: Ability to manage health-related needs will improve Outcome: Progressing   Problem: Clinical Measurements: Goal: Ability to maintain clinical measurements within normal limits will improve Outcome: Progressing Goal: Will remain free from infection Outcome: Progressing Goal: Diagnostic test results will improve Outcome: Progressing Goal: Respiratory complications will improve Outcome: Progressing Goal: Cardiovascular complication will be avoided Outcome: Progressing   Problem: Activity: Goal: Risk for activity intolerance will decrease Outcome: Progressing   Problem: Nutrition: Goal: Adequate nutrition will be maintained Outcome: Progressing   Problem: Coping: Goal: Level of anxiety will decrease Outcome: Progressing   Problem: Elimination: Goal: Will not experience complications related to bowel motility Outcome: Progressing Goal: Will not experience complications related to urinary retention Outcome: Progressing   Problem: Pain Managment: Goal: General experience of comfort will improve Outcome: Progressing   Problem: Safety: Goal: Ability to remain free from injury will improve Outcome: Progressing   Problem: Skin Integrity: Goal: Risk for impaired skin integrity will decrease Outcome: Progressing   Problem: Education: Goal: Knowledge of disease or condition will improve Outcome: Progressing Goal: Understanding of medication regimen will improve Outcome: Progressing Goal: Individualized Educational Video(s) Outcome: Progressing   Problem: Activity: Goal: Ability to tolerate increased activity will improve Outcome: Progressing   Problem: Cardiac: Goal: Ability to achieve and maintain  adequate cardiopulmonary perfusion will improve Outcome: Progressing   Problem: Health Behavior/Discharge Planning: Goal: Ability to safely manage health-related needs after discharge will improve Outcome: Progressing   Problem: Education: Goal: Will demonstrate proper wound care and an understanding of methods to prevent future damage Outcome: Progressing Goal: Knowledge of disease or condition will improve Outcome: Progressing Goal: Knowledge of the prescribed therapeutic regimen will improve Outcome: Progressing Goal: Individualized Educational Video(s) Outcome: Progressing   Problem: Activity: Goal: Risk for activity intolerance will decrease Outcome: Progressing   Problem: Cardiac: Goal: Will achieve and/or maintain hemodynamic stability Outcome: Progressing   Problem: Clinical Measurements: Goal: Postoperative complications will be avoided or minimized Outcome: Progressing   Problem: Respiratory: Goal: Respiratory status will improve Outcome: Progressing   Problem: Skin Integrity: Goal: Wound healing without signs and symptoms of infection Outcome: Progressing Goal: Risk for impaired skin integrity will decrease Outcome: Progressing   Problem: Urinary Elimination: Goal: Ability to achieve and maintain adequate renal perfusion and functioning will improve Outcome: Progressing   

## 2021-04-01 NOTE — Progress Notes (Addendum)
[address]       [place] [address]             [phone]      3 Days Post-Op Procedure(s) (LRB): CORONARY ARTERY BYPASS GRAFTING (CABG), ON PUMP, TIMES FOUR, USING LEFT INTERNAL MAMMARY ARTERY AND RIGHT ENDOSCOPICALLY HARVESTED GREATER SAPHENOUS VEIN (N/A) TRANSESOPHAGEAL ECHOCARDIOGRAM (TEE) (N/A) Subjective: Uncomfortable but mostly feels like he's making progress  Objective: Vital signs in last 24 hours: Temp:  [98 F (36.7 C)-98.9 F (37.2 C)] 98.2 F (36.8 C) (08/28 0747) Pulse Rate:  [76-107] 107 (08/28 0747) Cardiac Rhythm: Sinus tachycardia (08/28 0700) Resp:  [14-26] 18 (08/28 0747) BP: (97-140)/(56-79) 136/73 (08/28 0747) SpO2:  [90 %-97 %] 93 % (08/28 0747) Weight:  [84.3 kg-85.7 kg] 84.3 kg (08/28 0439)  Hemodynamic parameters for last 24 hours:    Intake/Output from previous day: 08/27 0701 - 08/28 0700 In: 360 [P.O.:360] Out: 400 [Urine:400] Intake/Output this shift: Total I/O In: 240 [P.O.:240] Out: -   General appearance: alert, cooperative, and no distress Heart: regular rate and rhythm Lungs: dim in bases Abdomen: benign Extremities: + edema Wound: evh site looks good, chest incision dressing to come off today  Lab Results: Recent Labs    03/31/21 0448 04/01/21 0110  WBC 8.3 8.9  HGB 8.5* 8.4*  HCT 26.0* 24.6*  PLT 97* 113*   BMET:  Recent Labs    03/31/21 0448 04/01/21 0110  NA 135 133*  K 3.8 4.0  CL 102 103  CO2 27 24  GLUCOSE 122* 109*  BUN 22 26*  CREATININE 1.33* 1.37*  CALCIUM 8.4* 8.2*    PT/INR:  Recent Labs    03/31/2021 1830  LABPROT 16.2*  INR 1.3*   ABG    Component Value Date/Time   PHART 7.299 (L) 03/11/2021 0010   HCO3 21.2 03/06/2021 0010   TCO2 23 03/17/2021 0010   ACIDBASEDEF 5.0 (H) 04/03/2021 0010   O2SAT 98.0 03/05/2021 0010   CBG (last 3)  Recent Labs    03/15/2021 2220 03/31/21 0617 03/31/21 1144  GLUCAP 133* 114* 110*    Meds Scheduled Meds:  acetaminophen   1,000 mg Oral Q6H   Or   acetaminophen (TYLENOL) oral liquid 160 mg/5 mL  1,000 mg Per Tube Q6H   amLODipine  10 mg Oral Daily   aspirin EC  325 mg Oral Daily   Chlorhexidine Gluconate Cloth  6 each Topical Daily   [facility], Patient [facility] Family Education   Does not apply Once   furosemide  40 mg Oral Daily   levothyroxine  75 mcg Oral QAC breakfast   mouth rinse  15 mL Mouth Rinse BID   metoprolol tartrate  12.5 mg Oral BID   pantoprazole  40 mg Oral QAC breakfast   potassium chloride  20 mEq Oral BID   rosuvastatin  10 mg Oral Daily   sodium chloride flush  3 mL Intravenous Q12H   Continuous Infusions:  sodium chloride     PRN Meds:.sodium chloride, ondansetron **OR** ondansetron (ZOFRAN) IV, oxyCODONE, senna-docusate, sodium chloride flush, traMADol  Xrays DG Chest Port 1 View  Result Date: 03/31/2021 CLINICAL DATA:  Chest pain. EXAM: PORTABLE CHEST 1 VIEW COMPARISON:  March 30, 2021. FINDINGS: Stable cardiomediastinal silhouette. Status post coronary bypass graft. Left-sided chest tube has been removed without pneumothorax. Bibasilar atelectasis is noted with probable small pleural effusions. Bony thorax is unremarkable. IMPRESSION: Status post left-sided chest tube removal without  definite pneumothorax. Bibasilar atelectasis. Electronically Signed   By: Marijo Conception M.D.   On: 03/31/2021 08:08     Results for orders placed or performed during the hospital encounter of 03/07/2021  Resp Panel by RT-PCR (Flu A&B, Covid) Nasopharyngeal Swab     Status: None   Collection Time: 03/20/2021  3:38 PM   Specimen: Nasopharyngeal Swab; Nasopharyngeal(NP) swabs in vial transport medium  Result Value Ref Range Status   SARS Coronavirus 2 by RT PCR NEGATIVE NEGATIVE Final    Comment: (NOTE) SARS-CoV-2 target nucleic acids are NOT DETECTED.  The SARS-CoV-2 RNA is generally detectable in upper respiratory specimens during the acute phase of infection. The  lowest concentration of SARS-CoV-2 viral copies this assay can detect is 138 copies/mL. A negative result does not preclude SARS-Cov-2 infection and should not be used as the sole basis for treatment or other patient management decisions. A negative result may occur with  improper specimen collection/handling, submission of specimen other than nasopharyngeal swab, presence of viral mutation(s) within the areas targeted by this assay, and inadequate number of viral copies(<138 copies/mL). A negative result must be combined with clinical observations, patient history, and epidemiological information. The expected result is Negative.  Fact Sheet for Patients:  EntrepreneurPulse.com.au  Fact Sheet for Healthcare Providers:  IncredibleEmployment.be  This test is no t yet approved or cleared by the [place] FDA and  has been authorized for detection and/or diagnosis of SARS-CoV-2 by FDA under an Emergency Use Authorization (EUA). This EUA will remain  in effect (meaning this test can be used) for the duration of the COVID-19 declaration under Section 564(b)(1) of the Act, 21 U.S.C.section 360bbb-3(b)(1), unless the authorization is terminated  or revoked sooner.       Influenza A by PCR NEGATIVE NEGATIVE Final   Influenza B by PCR NEGATIVE NEGATIVE Final    Comment: (NOTE) The Xpert Xpress SARS-CoV-2/FLU/RSV plus assay is intended as an aid in the diagnosis of influenza from Nasopharyngeal swab specimens and should not be used as a sole basis for treatment. Nasal washings and aspirates are unacceptable for Xpert Xpress SARS-CoV-2/FLU/RSV testing.  Fact Sheet for Patients: EntrepreneurPulse.com.au  Fact Sheet for Healthcare Providers: IncredibleEmployment.be  This test is not yet approved or cleared by the [place] FDA and has been authorized for detection and/or diagnosis of SARS-CoV-2 by FDA under  an Emergency Use Authorization (EUA). This EUA will remain in effect (meaning this test can be used) for the duration of the COVID-19 declaration under Section 564(b)(1) of the Act, 21 U.S.C. section 360bbb-3(b)(1), unless the authorization is terminated or revoked.  Performed at [facility], [address] [place], [place] [address]   Surgical PCR screen     Status: None   Collection Time: 03/11/2021 12:37 AM   Specimen: Nasal Mucosa; Nasal Swab  Result Value Ref Range Status   MRSA, PCR NEGATIVE NEGATIVE Final   Staphylococcus aureus NEGATIVE NEGATIVE Final    Comment: (NOTE) The Xpert SA Assay (FDA approved for NASAL specimens in patients 14 years of age and older), is one component of a comprehensive surveillance program. It is not intended to diagnose infection nor to guide or monitor treatment. Performed at [facility], [place] [address] [place], [place] [address]   Aerobic/Anaerobic Culture w Gram Stain (surgical/deep wound)     Status: None (Preliminary result)   Collection Time: 03/31/2021  2:17 PM   Specimen: PATH Soft tissue  Result Value Ref Range Status  Specimen Description TISSUE BIOPSY LEFT UPPER LUNG  Final   Special Requests PT ON ANCEF  Final   Gram Stain   Final    FEW SQUAMOUS EPITHELIAL CELLS PRESENT FEW WBC PRESENT, PREDOMINANTLY MONONUCLEAR NO ORGANISMS SEEN    Culture   Final    NO GROWTH 2 DAYS NO ANAEROBES ISOLATED; CULTURE IN PROGRESS FOR 5 DAYS Performed at [facility], [place] [address], [place], [place] [address]    Report Status PENDING  Incomplete       Assessment/Plan: S/P Procedure(s) (LRB): CORONARY ARTERY BYPASS GRAFTING (CABG), ON PUMP, TIMES FOUR, USING LEFT INTERNAL MAMMARY ARTERY AND RIGHT ENDOSCOPICALLY HARVESTED GREATER SAPHENOUS VEIN (N/A) TRANSESOPHAGEAL ECHOCARDIOGRAM (TEE) (N/A)  1 afeb, VSS s BP 90's-140's, sinus rhythm Wilburt Finlay- will increase beta blocker dose 2 sats good on RA 3 making urine- output  not accurate 4 weight approx 3 kg>preop, conts daily lasix for now, creat stable at 1.37 5 no leukocytosis 6 expected ABLA equalibrating- slightly down from yesterday but not in transfusion threshold- add trinsicon 7 thrombocytopenia trend improving 8 blood glucose controlled- no DM per hx, A1C 5.7 preop 9 pulm toilet/routine rehab 10 lung path/micro pending  LOS: 5 days    John Giovanni PA-C Pager [phone] 04/01/2021    Chart reviewed, patient examined, agree with above. He looks good overall. Still needs some diuresis.

## 2021-04-02 ENCOUNTER — Inpatient Hospital Stay (HOSPITAL_COMMUNITY): Payer: PPO

## 2021-04-02 ENCOUNTER — Other Ambulatory Visit (HOSPITAL_COMMUNITY): Payer: PPO

## 2021-04-02 ENCOUNTER — Inpatient Hospital Stay (HOSPITAL_COMMUNITY): Payer: PPO | Admitting: Anesthesiology

## 2021-04-02 DIAGNOSIS — I469 Cardiac arrest, cause unspecified: Secondary | ICD-10-CM | POA: Diagnosis not present

## 2021-04-02 LAB — PREPARE PLATELET PHERESIS
Unit division: 0
Unit division: 0
Unit division: 0
Unit division: 0

## 2021-04-02 LAB — POCT I-STAT 7, (LYTES, BLD GAS, ICA,H+H)
Acid-Base Excess: 5 mmol/L — ABNORMAL HIGH (ref 0.0–2.0)
Bicarbonate: 25.7 mmol/L (ref 20.0–28.0)
Calcium, Ion: 0.99 mmol/L — ABNORMAL LOW (ref 1.15–1.40)
HCT: 19 % — ABNORMAL LOW (ref 39.0–52.0)
Hemoglobin: 6.5 g/dL — CL (ref 13.0–17.0)
O2 Saturation: 100 %
Patient temperature: 97.9
Potassium: 4.3 mmol/L (ref 3.5–5.1)
Sodium: 144 mmol/L (ref 135–145)
TCO2: 26 mmol/L (ref 22–32)
pCO2 arterial: 21.2 mmHg — ABNORMAL LOW (ref 32.0–48.0)
pH, Arterial: 7.691 (ref 7.350–7.450)
pO2, Arterial: 261 mmHg — ABNORMAL HIGH (ref 83.0–108.0)

## 2021-04-02 LAB — BPAM PLATELET PHERESIS
Blood Product Expiration Date: 202208292359
Blood Product Expiration Date: 202208302359
Blood Product Expiration Date: 202209012359
Blood Product Expiration Date: 202209012359
ISSUE DATE / TIME: 202208291251
ISSUE DATE / TIME: 202208291251
ISSUE DATE / TIME: 202208291251
ISSUE DATE / TIME: 202208291251
Unit Type and Rh: 5100
Unit Type and Rh: 6200
Unit Type and Rh: 6200
Unit Type and Rh: 7300

## 2021-04-02 LAB — BASIC METABOLIC PANEL
Anion gap: 5 (ref 5–15)
BUN: 26 mg/dL — ABNORMAL HIGH (ref 8–23)
CO2: 26 mmol/L (ref 22–32)
Calcium: 8.2 mg/dL — ABNORMAL LOW (ref 8.9–10.3)
Chloride: 104 mmol/L (ref 98–111)
Creatinine, Ser: 1.36 mg/dL — ABNORMAL HIGH (ref 0.61–1.24)
GFR, Estimated: 52 mL/min — ABNORMAL LOW (ref >60)
Glucose, Bld: 109 mg/dL — ABNORMAL HIGH (ref 70–99)
Potassium: 4.3 mmol/L (ref 3.5–5.1)
Sodium: 135 mmol/L (ref 135–145)

## 2021-04-02 LAB — CBC
HCT: 24.6 % — ABNORMAL LOW (ref 39.0–52.0)
Hemoglobin: 8.2 g/dL — ABNORMAL LOW (ref 13.0–17.0)
MCH: 31.2 pg (ref 26.0–34.0)
MCHC: 33.3 g/dL (ref 30.0–36.0)
MCV: 93.5 fL (ref 80.0–100.0)
Platelets: 152 10*3/uL (ref 150–400)
RBC: 2.63 MIL/uL — ABNORMAL LOW (ref 4.22–5.81)
RDW: 14.3 % (ref 11.5–15.5)
WBC: 7.3 10*3/uL (ref 4.0–10.5)
nRBC: 0.5 % — ABNORMAL HIGH (ref 0.0–0.2)

## 2021-04-02 LAB — GLUCOSE, CAPILLARY: Glucose-Capillary: 109 mg/dL — ABNORMAL HIGH (ref 70–99)

## 2021-04-02 LAB — PREPARE RBC (CROSSMATCH)

## 2021-04-02 MED ORDER — FENTANYL CITRATE (PF) 100 MCG/2ML IJ SOLN
INTRAMUSCULAR | Status: AC
  Filled 2021-04-02: qty 2

## 2021-04-02 MED ORDER — EPINEPHRINE 1 MG/10ML IJ SOSY
PREFILLED_SYRINGE | INTRAMUSCULAR | Status: AC
  Filled 2021-04-02: qty 30

## 2021-04-02 MED ORDER — SODIUM CHLORIDE 0.9 % IV SOLN: 2.0000 g | Freq: Once | INTRAVENOUS | Status: DC

## 2021-04-02 MED ORDER — MIDAZOLAM-SODIUM CHLORIDE 100-0.9 MG/100ML-% IV SOLN
0.0000 mg/h | INTRAVENOUS | Status: DC
  Filled 2021-04-02: qty 100

## 2021-04-02 MED ORDER — EPINEPHRINE HCL 5 MG/250ML IV SOLN IN NS
INTRAVENOUS | Status: AC
  Filled 2021-04-02: qty 250

## 2021-04-02 MED ORDER — VANCOMYCIN HCL IN DEXTROSE 1-5 GM/200ML-% IV SOLN: 1000.0000 mg | Freq: Once | INTRAVENOUS | Status: DC

## 2021-04-02 MED ORDER — MIDAZOLAM BOLUS VIA INFUSION
0.0000 mg | INTRAVENOUS | Status: DC | PRN
  Filled 2021-04-02: qty 5

## 2021-04-02 MED FILL — Sodium Bicarbonate IV Soln 8.4%: INTRAVENOUS | Qty: 50 | Status: AC

## 2021-04-02 MED FILL — Calcium Chloride Inj 10%: INTRAVENOUS | Qty: 10 | Status: AC

## 2021-04-02 MED FILL — Heparin Sodium (Porcine) Inj 1000 Unit/ML: INTRAMUSCULAR | Qty: 10 | Status: AC

## 2021-04-02 MED FILL — Potassium Chloride Inj 2 mEq/ML: INTRAVENOUS | Qty: 40 | Status: AC

## 2021-04-02 MED FILL — Magnesium Sulfate Inj 50%: INTRAMUSCULAR | Qty: 10 | Status: AC

## 2021-04-02 MED FILL — Electrolyte-R (PH 7.4) Solution: INTRAVENOUS | Qty: 5000 | Status: AC

## 2021-04-02 MED FILL — Lidocaine HCl Local Preservative Free (PF) Inj 2%: INTRAMUSCULAR | Qty: 5 | Status: AC

## 2021-04-02 MED FILL — Heparin Sodium (Porcine) Inj 1000 Unit/ML: INTRAMUSCULAR | Qty: 30 | Status: AC

## 2021-04-02 MED FILL — Mannitol IV Soln 20%: INTRAVENOUS | Qty: 500 | Status: AC

## 2021-04-02 MED FILL — Sodium Chloride IV Soln 0.9%: INTRAVENOUS | Qty: 2000 | Status: AC

## 2021-04-03 LAB — TYPE AND SCREEN
ABO/RH(D): B POS
Antibody Screen: NEGATIVE
Unit division: 0
Unit division: 0
Unit division: 0
Unit division: 0
Unit division: 0
Unit division: 0
Unit division: 0
Unit division: 0
Unit division: 0
Unit division: 0
Unit division: 0
Unit division: 0
Unit division: 0
Unit division: 0
Unit division: 0
Unit division: 0
Unit division: 0
Unit division: 0
Unit division: 0
Unit division: 0
Unit division: 0
Unit division: 0
Unit division: 0
Unit division: 0

## 2021-04-03 LAB — BPAM RBC
Blood Product Expiration Date: 202209152359
Blood Product Expiration Date: 202209202359
Blood Product Expiration Date: 202209222359
Blood Product Expiration Date: 202209222359
Blood Product Expiration Date: 202209292359
Blood Product Expiration Date: 202209292359
Blood Product Expiration Date: 202209292359
Blood Product Expiration Date: 202209292359
Blood Product Expiration Date: 202209292359
Blood Product Expiration Date: 202209292359
Blood Product Expiration Date: 202209292359
Blood Product Expiration Date: 202209292359
Blood Product Expiration Date: 202209292359
Blood Product Expiration Date: 202209292359
Blood Product Expiration Date: 202209292359
Blood Product Expiration Date: 202209292359
Blood Product Expiration Date: 202209292359
Blood Product Expiration Date: 202209292359
Blood Product Expiration Date: 202209292359
Blood Product Expiration Date: 202209292359
Blood Product Expiration Date: 202209292359
Blood Product Expiration Date: 202209302359
Blood Product Expiration Date: 202209302359
Blood Product Expiration Date: 202209302359
ISSUE DATE / TIME: 202208291219
ISSUE DATE / TIME: 202208291219
ISSUE DATE / TIME: 202208291219
ISSUE DATE / TIME: 202208291219
ISSUE DATE / TIME: 202208291222
ISSUE DATE / TIME: 202208291222
ISSUE DATE / TIME: 202208291222
ISSUE DATE / TIME: 202208291222
ISSUE DATE / TIME: 202208291227
ISSUE DATE / TIME: 202208291227
ISSUE DATE / TIME: 202208291227
ISSUE DATE / TIME: 202208291227
ISSUE DATE / TIME: 202208291245
ISSUE DATE / TIME: 202208291245
ISSUE DATE / TIME: 202208291253
ISSUE DATE / TIME: 202208291253
ISSUE DATE / TIME: 202208291253
ISSUE DATE / TIME: 202208291253
ISSUE DATE / TIME: 202208291320
ISSUE DATE / TIME: 202208291320
ISSUE DATE / TIME: 202208291320
ISSUE DATE / TIME: 202208291320
ISSUE DATE / TIME: 202208291658
ISSUE DATE / TIME: 202208291802
Unit Type and Rh: 5100
Unit Type and Rh: 5100
Unit Type and Rh: 5100
Unit Type and Rh: 5100
Unit Type and Rh: 5100
Unit Type and Rh: 5100
Unit Type and Rh: 5100
Unit Type and Rh: 5100
Unit Type and Rh: 5100
Unit Type and Rh: 5100
Unit Type and Rh: 5100
Unit Type and Rh: 5100
Unit Type and Rh: 5100
Unit Type and Rh: 5100
Unit Type and Rh: 5100
Unit Type and Rh: 5100
Unit Type and Rh: 5100
Unit Type and Rh: 5100
Unit Type and Rh: 5100
Unit Type and Rh: 5100
Unit Type and Rh: 7300
Unit Type and Rh: 7300
Unit Type and Rh: 7300
Unit Type and Rh: 7300

## 2021-04-03 LAB — PREPARE FRESH FROZEN PLASMA
Unit division: 0
Unit division: 0
Unit division: 0
Unit division: 0
Unit division: 0
Unit division: 0

## 2021-04-03 LAB — BPAM FFP
Blood Product Expiration Date: 202209022359
Blood Product Expiration Date: 202209022359
Blood Product Expiration Date: 202209032359
Blood Product Expiration Date: 202209032359
Blood Product Expiration Date: 202209032359
Blood Product Expiration Date: 202209032359
Blood Product Expiration Date: 202209032359
Blood Product Expiration Date: 202209032359
Blood Product Expiration Date: 202209032359
Blood Product Expiration Date: 202209032359
Blood Product Expiration Date: 202209032359
Blood Product Expiration Date: 202209032359
ISSUE DATE / TIME: 202208291237
ISSUE DATE / TIME: 202208291237
ISSUE DATE / TIME: 202208291237
ISSUE DATE / TIME: 202208291237
ISSUE DATE / TIME: 202208291256
ISSUE DATE / TIME: 202208291256
ISSUE DATE / TIME: 202208291256
ISSUE DATE / TIME: 202208291256
ISSUE DATE / TIME: 202208291316
ISSUE DATE / TIME: 202208291316
ISSUE DATE / TIME: 202208291316
ISSUE DATE / TIME: 202208291316
Unit Type and Rh: 1700
Unit Type and Rh: 6200
Unit Type and Rh: 6200
Unit Type and Rh: 6200
Unit Type and Rh: 6200
Unit Type and Rh: 7300
Unit Type and Rh: 7300
Unit Type and Rh: 7300
Unit Type and Rh: 7300
Unit Type and Rh: 7300
Unit Type and Rh: 7300
Unit Type and Rh: 7300

## 2021-04-03 LAB — AEROBIC/ANAEROBIC CULTURE W GRAM STAIN (SURGICAL/DEEP WOUND): Culture: NO GROWTH

## 2021-04-03 MED FILL — Medication: Qty: 3 | Status: AC

## 2021-04-03 NOTE — Anesthesia Postprocedure Evaluation (Signed)
Anesthesia Post Note  Patient: Steven Valenzuela  Procedure(s) Performed: CORONARY ARTERY BYPASS GRAFTING (CABG), ON PUMP, TIMES FOUR, USING LEFT INTERNAL MAMMARY ARTERY AND RIGHT ENDOSCOPICALLY HARVESTED GREATER SAPHENOUS VEIN (Chest) TRANSESOPHAGEAL ECHOCARDIOGRAM (TEE)     Patient location during evaluation: ICU Anesthesia Type: General Level of consciousness: sedated Pain management: pain level controlled Vital Signs Assessment: post-procedure vital signs reviewed and stable Respiratory status: respiratory function stable and patient remains intubated per anesthesia plan Cardiovascular status: stable Anesthetic complications: no   No notable events documented.  Last Vitals:  Vitals:   04-06-2021 1319 2021-04-06 1320  BP:    Pulse:    Resp: (!) 8 12  Temp:    SpO2:      Last Pain:  Vitals:   April 06, 2021 0854  TempSrc:   PainSc: 0-No pain                 Inita Uram

## 2021-04-05 LAB — SURGICAL PATHOLOGY

## 2021-04-05 NOTE — Progress Notes (Signed)
1052-1115 Education completed with pt.,wife and daughter who voiced understanding. Reviewed sternal precautions, staying in the tube, IS, walking for ex, wound care, and heart healthy food choices. Discussed CRP 2 and will refer to [place] program. Graylon Good RN BSN 04/20/21 11:15 AM

## 2021-04-05 NOTE — Progress Notes (Signed)
1125 Patient EPW pulled per protocol and as ordered. All ends intact. Patient reminded to stay in bed for one hour. Hr in the 90s on the monitor.   [id number] While RN still present in room,  patient with complaints of right shoulder pain and being nauseated. Patient reposistioned and Emesis bag given, BP cycled at that time 68/43 heart rate 94.  Then 72/51, Patient became unresponsive with out a pulse, compressions started,  code blue called.  See code sheet for documentation.  Avrey Hyser, Bettina Gavia RN

## 2021-04-05 NOTE — Progress Notes (Signed)
Two vials of 2 mg midazolam were unloaded instead of pulled under the patient name. Vials were wasted appropriately, Gorden Harms, PharmD and Laurey Arrow, PharmD witnessed.

## 2021-04-05 NOTE — Progress Notes (Addendum)
   05/01/2021 1130  Clinical Encounter Type  Visited With Patient and family together  Visit Type Code;Death  Referral From Nurse  Consult/Referral To Chaplain   Chaplain responded to code blue. Provided the presence of comfort, grief, and emotional support to the patient's wife, Hagan Barcelo, and his daughter Chandra Batch.  This chaplain remained with the family throughout the accounts of events from 4E27 to [phone]. This chaplain engaged in reflective listening and actively listened to stories. The patient's son-in-law, Patrick Jupiter, also visited. He is the spouse of the patient's deceased daughter. This chaplain prayed with the family. This chaplain gave Mrs. Sunday Spillers (wife) the Patient Placement card. The family selected [facility], [address] [place], [place]. Mrs. Sunday Spillers resides in the home [place] and her contact [phone] and [phone]. Tricia"s [phone] her address [id number]. The nurse will do 5 handprints and call this chaplain when they are ready. Spiritual Care dept will mail to the patient's wife. This chaplain escorted the family out and the family expressed their gratitude of how well they were treated and ended this chaplain visits with a hug. This note was prepared by Jeanine Luz, M.Div..  For questions please contact by phone [phone].

## 2021-04-05 NOTE — Progress Notes (Signed)
ABG results given to Dr Roxan Hockey.

## 2021-04-05 NOTE — Progress Notes (Addendum)
      [address]       [place] [address]             [phone]      4 Days Post-Op Procedure(s) (LRB): CORONARY ARTERY BYPASS GRAFTING (CABG), ON PUMP, TIMES FOUR, USING LEFT INTERNAL MAMMARY ARTERY AND RIGHT ENDOSCOPICALLY HARVESTED GREATER SAPHENOUS VEIN (N/A) TRANSESOPHAGEAL ECHOCARDIOGRAM (TEE) (N/A) Subjective: Wants to go home today. No complaints.   Objective: Vital signs in last 24 hours: Temp:  [98.2 F (36.8 C)-99.1 F (37.3 C)] 98.4 F (36.9 C) (08/29 0443) Pulse Rate:  [92-107] 97 (08/29 0443) Cardiac Rhythm: Normal sinus rhythm (08/28 1950) Resp:  [17-19] 17 (08/29 0443) BP: (109-136)/(56-73) 121/60 (08/29 0443) SpO2:  [93 %-100 %] 100 % (08/29 0443) Weight:  [82.9 kg] 82.9 kg (08/29 0330)     Intake/Output from previous day: 08/28 0701 - 08/29 0700 In: 340 [P.O.:340] Out: 700 [Urine:700] Intake/Output this shift: No intake/output data recorded.  General appearance: alert, cooperative, and no distress Heart: regular rate and rhythm, S1, S2 normal, no murmur, click, rub or gallop Lungs: clear to auscultation bilaterally Abdomen: soft, non-tender; bowel sounds normal; no masses,  no organomegaly Extremities: extremities normal, atraumatic, no cyanosis or edema Wound: clean and dry  Lab Results: Recent Labs    04/01/21 0110 April 06, 2021 0226  WBC 8.9 7.3  HGB 8.4* 8.2*  HCT 24.6* 24.6*  PLT 113* 152   BMET:  Recent Labs    04/01/21 0110 April 06, 2021 0226  NA 133* 135  K 4.0 4.3  CL 103 104  CO2 24 26  GLUCOSE 109* 109*  BUN 26* 26*  CREATININE 1.37* 1.36*  CALCIUM 8.2* 8.2*    PT/INR: No results for input(s): LABPROT, INR in the last 72 hours. ABG    Component Value Date/Time   PHART 7.299 (L) 03/28/2021 0010   HCO3 21.2 04/01/2021 0010   TCO2 23 03/07/2021 0010   ACIDBASEDEF 5.0 (H) 03/24/2021 0010   O2SAT 98.0 03/08/2021 0010   CBG (last 3)  Recent Labs    03/19/2021 2220 03/31/21 0617 03/31/21 1144  GLUCAP 133*  114* 110*    Assessment/Plan: S/P Procedure(s) (LRB): CORONARY ARTERY BYPASS GRAFTING (CABG), ON PUMP, TIMES FOUR, USING LEFT INTERNAL MAMMARY ARTERY AND RIGHT ENDOSCOPICALLY HARVESTED GREATER SAPHENOUS VEIN (N/A) TRANSESOPHAGEAL ECHOCARDIOGRAM (TEE) (N/A)  CV-Sinus tachycardia, BP well controlled, continue asa/norvasc/BB/statin Pulm- tolerating room air with excellent saturation Renal-creatinine 1.36, electrolytes okay, continue lasix for fluid overload.  Endo-blood glucose well controlled, hypothyroidism-continue synthroid H and H 8.2/24.6, stable  Plan: Remove EPW today, continue to watch rhythm closely. Metoprolol increased over the weekend for sinus tachycardia. Continue to encourage ambulation and use of incentive spirometer. Possibly home later today vs. Tomorrow.    LOS: 6 days    Elgie Collard 04/06/2021  Patient seen and examined, agree with above Likely home later today  [place]. Roxan Hockey, MD [facility] [phone]

## 2021-04-05 NOTE — Care Management Important Message (Signed)
Important Message  Patient Details  Name: Bladen Bevilacqua MRN: [id number] Date of Birth: [birth year]   Medicare Important Message Given:  Yes     Shelda Altes 2021-04-04, 11:32 AM

## 2021-04-05 NOTE — Anesthesia Procedure Notes (Signed)
Procedure Name: Intubation Date/Time: 04/05/21 11:40 AM Performed by: Kyung Rudd, CRNA Pre-anesthesia Checklist: Patient identified, Emergency Drugs available, Suction available and Patient being monitored Oxygen Delivery Method: Circle system utilized Preoxygenation: Pre-oxygenation with 100% oxygen Laryngoscope Size: Glidescope and 4 Grade View: Grade I Tube type: Oral Tube size: 7.5 mm Number of attempts: 1 Airway Equipment and Method: Video-laryngoscopy Placement Confirmation: ETT inserted through vocal cords under direct vision, positive ETCO2 and breath sounds checked- equal and bilateral Secured at: 22 cm Tube secured with: Tape Dental Injury: Teeth and Oropharynx as per pre-operative assessment

## 2021-04-05 NOTE — Progress Notes (Signed)
      [address]       [place] [address]             [phone]      CTSP at 1130 for loss of pulse after removal of pacing wires  On arrival to bedside CPR was in progress. With ACLS he did have transient ROSC. With resuscitation underway brought emergently to ICU. Dr. Carlis Abbott from [facility] present and assisted with managing code. Bedside echo showed no LV contraction and small effusion.   Decision made to open chest as tamponade was most likely scenario.  Sternum reopened emergently. There was a moderate amount of clot in pericardium and a large amount of blood in left pleural space. No spontaneous cardiac contraction although there was a narrow complex rhythm. Open massage performed and epi given directly in RV. There was bleeding from the vein graft to the PDA with an avulsed branch bleeding profusely.  This was repaired with a 6-0 prolene suture. Resuscitation continued. Dr. Kipp Brood placed arterial and venous lines in right femoral vessels as I continued open massage.  Massive transfusions given via blood warmer. Developed RV tears after about 30 minutes of open massage.  He did recover an agonal rhythm but had ongoing PEA unresponsive to any intervention.  Further resuscitation was felt to be futile and patient expired at [place]. Roxan Hockey, MD [facility] [phone]

## 2021-04-05 NOTE — Progress Notes (Signed)
Responded to urgent consult post-arrest POD #4 CABG x4. PEA arrest after pacer wires removed. Intubated by anesthesia. Had recurrent PEA arrest after transfer to ICU. ACLS protocol followed with epi, chest compressions. Chest opened by Dr. Roxan Hockey with open cardiac massage, epi. Epi infusion and norepi infusion continued throughout. Total 14 units pRBCs, several liters of saline, 2 units FFP given. Calcium, bicarb, magnesium given. Large bore CVC placed during resuscitation. Had VF rhythm during the resuscitation treated with defibrillation, amiodarone 300 + 150, lidocaine '1mg'$ .  >70 minutes resuscitation in total without return of cardiac activity. See full code note for complete documentation. Family present in the unit during resuscitation and updated he had passed.   Julian Hy, DO 04/17/2021 1:33 PM [facility]

## 2021-04-05 NOTE — Progress Notes (Signed)
Pt belongings including glasses and dentures left at [place] with secretary.  Clyde Canterbury, RN

## 2021-04-05 NOTE — Progress Notes (Signed)
CARDIAC REHAB PHASE I   PRE:  Rate/Rhythm: 102 ST  BP:  Supine:   Sitting: 116/62  Standing:    SaO2: 94%RA  MODE:  Ambulation: 580 ft   POST:  Rate/Rhythm: 120 ST  BP:  Supine:   Sitting: 135/61  Standing:    SaO2: 97%RA 0938-1000 Pt walked 580 ft on RA with rolling walker with steady gait. Stated has walker at home. Tolerated well. To recliner with call bell. Will educate later when wife here.   Graylon Good, RN BSN  04/30/2021 9:58 AM

## 2021-04-05 NOTE — Death Summary Note (Addendum)
[address]       [place] [address]             [phone]    Physician Death Summary  Patient ID: Steven Valenzuela MRN: [id number] DOB/AGE: Aug 02, 1936 85 y.o.  Admit date: 04-Apr-2021  Death date: 04/14/2021  Admission Diagnoses: Coronary artery disease  Patient Active Problem List   Diagnosis Date Noted   S/P CABG x 4 03/21/2021   Chest pain of uncertain etiology [id number]   Unstable angina ([place]) 04/04/21   Bradycardia    Vitamin B12 deficiency    Thyroid disease    Internal hemorrhoids    Gastroduodenitis    Diverticulosis    Anemia    Hypercholesteremia    History of bleeding peptic ulcer 08/01/2020   Anemia of unknown etiology 06/26/2020   Stage 3b chronic kidney disease ([place]) 06/26/2020   Atherosclerosis of both carotid arteries 09/27/2016   Degenerative cervical disc 09/25/2016   Lumbar radiculopathy, chronic 02/21/2016   Benign prostatic hyperplasia without urinary obstruction 01/15/2016   Cardiac arrhythmia 01/15/2016   Carpal tunnel syndrome 01/15/2016   Deficiency of other specified B group vitamins 01/15/2016   ED (erectile dysfunction) of organic origin 01/15/2016   Essential (primary) hypertension 01/15/2016   GERD (gastroesophageal reflux disease) 01/15/2016   Hypertrophy of breast 01/15/2016   Hypogonadism in male 01/15/2016   Hypothyroidism, unspecified 01/15/2016   Neuropathy 01/15/2016   Pure hypercholesterolemia 01/15/2016   Unilateral inguinal hernia, without obstruction or gangrene, not specified as recurrent 01/15/2016   Balance problem 10/11/2015   Numbness in feet 10/11/2015   Pain in both feet 10/11/2015     Discharge Diagnoses:  Coronary artery disease Avulsion of vein graft branch with cardiac tamponade and arrest due to pacemaker wire removal Patient Active Problem List   Diagnosis Date Noted   S/P CABG x 4 03/09/2021   Chest pain of uncertain etiology [id number]   Unstable angina ([place]) 04/04/2021    Bradycardia    Vitamin B12 deficiency    Thyroid disease    Internal hemorrhoids    Gastroduodenitis    Diverticulosis    Anemia    Hypercholesteremia    History of bleeding peptic ulcer 08/01/2020   Anemia of unknown etiology 06/26/2020   Stage 3b chronic kidney disease ([place]) 06/26/2020   Atherosclerosis of both carotid arteries 09/27/2016   Degenerative cervical disc 09/25/2016   Lumbar radiculopathy, chronic 02/21/2016   Benign prostatic hyperplasia without urinary obstruction 01/15/2016   Cardiac arrhythmia 01/15/2016   Carpal tunnel syndrome 01/15/2016   Deficiency of other specified B group vitamins 01/15/2016   ED (erectile dysfunction) of organic origin 01/15/2016   Essential (primary) hypertension 01/15/2016   GERD (gastroesophageal reflux disease) 01/15/2016   Hypertrophy of breast 01/15/2016   Hypogonadism in male 01/15/2016   Hypothyroidism, unspecified 01/15/2016   Neuropathy 01/15/2016   Pure hypercholesterolemia 01/15/2016   Unilateral inguinal hernia, without obstruction or gangrene, not specified as recurrent 01/15/2016   Balance problem 10/11/2015   Numbness in feet 10/11/2015   Pain in both feet 10/11/2015       History of Present Illness:    At time of consultation. [name] is a very pleasant 85 year old male with past history significant for hypertension, dyslipidemia, stage III chronic kidney disease.  He has been having some exertional chest discomfort and tightness for about a month.  He had a more intense episode of chest pain all the way across his chest and radiating down  both arms this past Saturday, 03/24/2021.  He contacted his primary care physician was advised to proceed to the [facility].  He was evaluated there with an EKG that showed sinus rhythm and no ischemic changes.  Chest x-ray was unremarkable.  High-sensitivity troponin level was also normal.  The BNP was 90.  He was hypertensive with a systolic blood pressure in the  180s.  Transfer to [facility] for further evaluation was recommended.  He remained stable following transfer.  He was admitted by the cardiology service.  Left heart catheterization was recommended and carried out earlier today.  This demonstrates three-vessel coronary artery disease with preserved LV function.  There is a 70% proximal RCA stenosis followed by high-grade stenoses in the distal RCA.  There was a 70% left main coronary artery stenosis followed by 75% mid LAD stenosis and 75% proximal circumflex stenosis.  CT surgery has been asked to evaluate Steven Valenzuela for consideration of operative revascularization.   Currently, Steven Valenzuela is resting in bed with his family at the bedside.  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  He reports he has been healthy overall.  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  He has never had any type of surgery.  He does report having some superficial varicosities in his right medial thigh and in his left lower leg.  The patient and all relevant studies were reviewed by Dr. Roxan Hockey who recommended proceeding with coronary artery surgical revascularization as his best option due to the severity of the anatomical findings and nature of his symptoms.  Hospital course:  Patient was felt to be medically stable to proceed with CABG on 03/13/2021.  He was taken the operating room where he underwent CABG x4 by Dr. Roxan Hockey.  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  Overall he tolerated the procedure very well and was taken to the surgical intensive care unit in stable condition.  Postoperative hospital course:  On postoperative day 1 he was felt to be doing quite well.  He was extubated  using standard post cardiac surgical protocols without difficulty.  He is started on a course of routine pulmonary toilet for postoperative atelectasis.  He did have some postoperative hypertension requiring nitroglycerin drip and he has been transitioned to beta-blocker and amlodipine.  His cardiac indices as well as EKG were felt to be stable in appearance with the exception of some findings consistent with possible pericarditis.  His blood sugars have been under adequate control using standard measures.  He does have a expected acute blood loss anemia and hemoglobin hematocrit are being trended.  He is not currently in the transfusion threshold.  Renal function has remained within normal limits.  He does have an expected postoperative volume overload and will be given a course of diuretics.  All routine lines, monitors drainage devices have been discontinued in standard fashion. He was making good progress on the floor. POD 4 we ordered to removed his EPW and planned to send him home later that day.  A few minutes after the pacing wires were pulled per protocol the patient complained of some right shoulder pain and nausea.  His blood pressure was cycled and  at the time it dropped to 68/43 with a heart rate of 94.  The patient at this time became unresponsive without a pulse and compressions were started, a CODE BLUE was called.  Please see the code sheet for full documentation. Critical care responded to the bedside as well as Dr. Roxan Hockey, myself, and Enid Cutter, PA-C.  The patient was intubated shortly after. A stat echocardiogram order was placed and the echo department was contacted.  He was transferred down to the ICU and on arrival lost his pulse again.  ACLS protocol followed with epinephrine and chest compressions.  The chest was then opened at the bedside by Dr. Roxan Hockey and open massage was performed and epi given directly in the RV.  Please refer to Dr. Leonarda Salon note from 8/29 at 1:36  PM.  Dr. Kipp Brood scrubbed in at the bedside to assist.  Massive transfusions given via blood warmer.  The patient did not recover and had ongoing PEA unresponsive to any intervention.  Further resuscitation was felt to be futile and the patient expired at 1319.  *I was physically present for the first portion of the resuscitation efforts but was not present at the time of expiration.   (I was present at time of death, [facility])  Consults: cardiology  Significant Diagnostic Studies:   Cardiac Cath:   Left Main  Ost LM to Mid LM lesion is 70% stenosed. Significant pressure dampening with catheter engagement.  Left Anterior Descending  Prox LAD lesion is 50% stenosed.  Mid LAD lesion is 75% stenosed.  Left Circumflex  Prox Cx lesion is 75% stenosed.  First Obtuse Marginal Branch  1st Mrg lesion is 80% stenosed.  Right Coronary Artery  Ost RCA to Prox RCA lesion is 70% stenosed. The lesion is moderately calcified.  Dist RCA-1 lesion is 90% stenosed.  Dist RCA-2 lesion is 75% stenosed.     Treatments:   NAME: DEVERY, OBERHELMAN MEDICAL RECORD NO: [id number] ACCOUNT NO: [id number] DATE OF BIRTH: [birth year] FACILITY: [facility] LOCATION: [facility] PHYSICIAN: Revonda Standard. Roxan Hockey, MD   Operative Report    DATE OF PROCEDURE: 03/11/2021   PREOPERATIVE DIAGNOSIS:  Three-vessel coronary artery disease.   POSTOPERATIVE DIAGNOSIS:  Three-vessel coronary artery disease.   PROCEDURES PERFORMED:  Median sternotomy, extracorporeal circulation, coronary artery bypass grafting x 4 (left internal mammary artery to LAD, saphenous vein graft to posterior descending, sequential saphenous vein graft to obtuse marginals 1 and 2),  endoscopic vein harvest left leg and biopsy of the left upper lobe.   SURGEON: Modesto Charon, MD   ASSISTANT:  Jadene Pierini.   ANESTHESIA:  General.   FINDINGS:  Transesophageal echocardiography revealed mild mitral regurgitation and preserved left ventricular function,  diffusely diseased fair quality target vessels, OM2 poor quality, posterolateral too small to graft.  Unusual nodular appearance of  lung, biopsy taken from left upper lobe for permanent pathology   Discharge Exam: Blood pressure (!) 196/28, pulse (!) 117, temperature 97.9 F (36.6 C), temperature source Oral, resp. rate 12, height '5\' 10"'$  (1.778 m), weight 82.9 kg, SpO2 (!) 74 %.   Signed: Nicholes Rough, PA-C 04/05/2021, 9:07 AM

## 2021-04-05 NOTE — Progress Notes (Signed)
Critical ABG values given to Dr. Roxan Hockey and Dr. Carlis Abbott.

## 2021-04-05 DEATH — deceased

## 2021-04-11 LAB — ACID FAST SMEAR (AFB, MYCOBACTERIA): Acid Fast Smear: NEGATIVE

## 2021-04-24 ENCOUNTER — Ambulatory Visit: Payer: PPO | Admitting: Thoracic Surgery (Cardiothoracic Vascular Surgery)

## 2021-05-02 ENCOUNTER — Ambulatory Visit: Payer: PPO | Admitting: Cardiology

## 2021-05-10 LAB — FUNGAL ORGANISM REFLEX

## 2021-05-10 LAB — FUNGUS CULTURE WITH STAIN

## 2021-05-10 LAB — FUNGUS CULTURE RESULT

## 2021-05-23 LAB — ACID FAST CULTURE WITH REFLEXED SENSITIVITIES (MYCOBACTERIA): Acid Fast Culture: NEGATIVE

## 2021-06-25 LAB — ECHO INTRAOPERATIVE TEE
AV Mean grad: 2 mmHg
Height: 70.5 in
LVOT diameter: 20 mm
MV Vena cont: 0.3 cm
Mean grad: 1 mmHg
STJ: 2.6 cm
Sinus: 3.3 cm
Weight: 2880 oz

## 2023-06-05 ENCOUNTER — Encounter (HOSPITAL_COMMUNITY): Payer: Self-pay | Admitting: Thoracic Surgery (Cardiothoracic Vascular Surgery)
